# Patient Record
Sex: Female | Born: 1960
Health system: Southern US, Community
[De-identification: ages and names within clinical notes are randomized; demographics above are authoritative.]

## PROBLEM LIST (undated history)

## (undated) DIAGNOSIS — M797 Fibromyalgia: Secondary | ICD-10-CM

## (undated) DIAGNOSIS — R1310 Dysphagia, unspecified: Secondary | ICD-10-CM

## (undated) DIAGNOSIS — U071 COVID-19: Secondary | ICD-10-CM

## (undated) DIAGNOSIS — M1990 Unspecified osteoarthritis, unspecified site: Secondary | ICD-10-CM

## (undated) DIAGNOSIS — K219 Gastro-esophageal reflux disease without esophagitis: Secondary | ICD-10-CM

## (undated) DIAGNOSIS — E785 Hyperlipidemia, unspecified: Secondary | ICD-10-CM

## (undated) DIAGNOSIS — E538 Deficiency of other specified B group vitamins: Secondary | ICD-10-CM

## (undated) DIAGNOSIS — H269 Unspecified cataract: Secondary | ICD-10-CM

## (undated) DIAGNOSIS — F419 Anxiety disorder, unspecified: Secondary | ICD-10-CM

## (undated) DIAGNOSIS — B029 Zoster without complications: Secondary | ICD-10-CM

## (undated) DIAGNOSIS — T7840XA Allergy, unspecified, initial encounter: Secondary | ICD-10-CM

## (undated) DIAGNOSIS — D649 Anemia, unspecified: Secondary | ICD-10-CM

## (undated) DIAGNOSIS — F32A Depression, unspecified: Secondary | ICD-10-CM

## (undated) HISTORY — DX: Gastro-esophageal reflux disease without esophagitis: K21.9

## (undated) HISTORY — PX: COLONOSCOPY: SHX174

## (undated) HISTORY — DX: Allergy, unspecified, initial encounter: T78.40XA

## (undated) HISTORY — DX: Deficiency of other specified B group vitamins: E53.8

## (undated) HISTORY — PX: APPENDECTOMY: SHX54

## (undated) HISTORY — PX: SHOULDER SURGERY: SHX246

## (undated) HISTORY — PX: HEMORRHOID SURGERY: SHX153

## (undated) HISTORY — DX: Anxiety disorder, unspecified: F41.9

## (undated) HISTORY — DX: Fibromyalgia: M79.7

## (undated) HISTORY — PX: ABDOMINAL HYSTERECTOMY: SHX81

## (undated) HISTORY — DX: Unspecified cataract: H26.9

## (undated) HISTORY — PX: CHOLECYSTECTOMY: SHX55

## (undated) HISTORY — DX: Depression, unspecified: F32.A

## (undated) HISTORY — PX: CATARACT EXTRACTION: SUR2

## (undated) HISTORY — DX: Anemia, unspecified: D64.9

## (undated) HISTORY — DX: COVID-19: U07.1

## (undated) HISTORY — DX: Dysphagia, unspecified: R13.10

## (undated) HISTORY — DX: Zoster without complications: B02.9

## (undated) HISTORY — DX: Hyperlipidemia, unspecified: E78.5

## (undated) HISTORY — DX: Unspecified osteoarthritis, unspecified site: M19.90

---

## 2000-03-06 ENCOUNTER — Ambulatory Visit (HOSPITAL_COMMUNITY): Admission: RE | Admit: 2000-03-06 | Discharge: 2000-03-06 | Payer: Self-pay | Admitting: Family Medicine

## 2000-03-06 ENCOUNTER — Encounter: Payer: Self-pay | Admitting: Family Medicine

## 2011-12-13 ENCOUNTER — Encounter: Payer: Self-pay | Admitting: Gastroenterology

## 2011-12-13 HISTORY — PX: ESOPHAGOGASTRODUODENOSCOPY: SHX1529

## 2011-12-13 HISTORY — PX: COLONOSCOPY: SHX174

## 2014-05-12 DIAGNOSIS — E785 Hyperlipidemia, unspecified: Secondary | ICD-10-CM | POA: Diagnosis not present

## 2014-05-12 DIAGNOSIS — Z79899 Other long term (current) drug therapy: Secondary | ICD-10-CM | POA: Diagnosis not present

## 2014-05-12 DIAGNOSIS — Z1231 Encounter for screening mammogram for malignant neoplasm of breast: Secondary | ICD-10-CM | POA: Diagnosis not present

## 2014-05-12 DIAGNOSIS — E559 Vitamin D deficiency, unspecified: Secondary | ICD-10-CM | POA: Diagnosis not present

## 2014-05-12 DIAGNOSIS — N951 Menopausal and female climacteric states: Secondary | ICD-10-CM | POA: Diagnosis not present

## 2014-05-12 DIAGNOSIS — G4700 Insomnia, unspecified: Secondary | ICD-10-CM | POA: Diagnosis not present

## 2014-05-12 DIAGNOSIS — F419 Anxiety disorder, unspecified: Secondary | ICD-10-CM | POA: Diagnosis not present

## 2014-05-12 DIAGNOSIS — Z6829 Body mass index (BMI) 29.0-29.9, adult: Secondary | ICD-10-CM | POA: Diagnosis not present

## 2014-05-29 DIAGNOSIS — Z1231 Encounter for screening mammogram for malignant neoplasm of breast: Secondary | ICD-10-CM | POA: Diagnosis not present

## 2014-06-08 DIAGNOSIS — R928 Other abnormal and inconclusive findings on diagnostic imaging of breast: Secondary | ICD-10-CM | POA: Diagnosis not present

## 2014-08-04 DIAGNOSIS — M791 Myalgia: Secondary | ICD-10-CM | POA: Diagnosis not present

## 2014-08-04 DIAGNOSIS — M25511 Pain in right shoulder: Secondary | ICD-10-CM | POA: Diagnosis not present

## 2014-08-04 DIAGNOSIS — Z6829 Body mass index (BMI) 29.0-29.9, adult: Secondary | ICD-10-CM | POA: Diagnosis not present

## 2014-10-27 DIAGNOSIS — Z6829 Body mass index (BMI) 29.0-29.9, adult: Secondary | ICD-10-CM | POA: Diagnosis not present

## 2014-10-27 DIAGNOSIS — N951 Menopausal and female climacteric states: Secondary | ICD-10-CM | POA: Diagnosis not present

## 2014-10-27 DIAGNOSIS — M25511 Pain in right shoulder: Secondary | ICD-10-CM | POA: Diagnosis not present

## 2014-11-09 DIAGNOSIS — M25511 Pain in right shoulder: Secondary | ICD-10-CM | POA: Diagnosis not present

## 2014-11-17 DIAGNOSIS — M25511 Pain in right shoulder: Secondary | ICD-10-CM | POA: Diagnosis not present

## 2014-11-25 DIAGNOSIS — N951 Menopausal and female climacteric states: Secondary | ICD-10-CM | POA: Diagnosis not present

## 2014-11-25 DIAGNOSIS — F419 Anxiety disorder, unspecified: Secondary | ICD-10-CM | POA: Diagnosis not present

## 2014-11-25 DIAGNOSIS — G4700 Insomnia, unspecified: Secondary | ICD-10-CM | POA: Diagnosis not present

## 2014-11-30 DIAGNOSIS — L570 Actinic keratosis: Secondary | ICD-10-CM | POA: Diagnosis not present

## 2014-11-30 DIAGNOSIS — M545 Low back pain: Secondary | ICD-10-CM | POA: Diagnosis not present

## 2014-11-30 DIAGNOSIS — M25511 Pain in right shoulder: Secondary | ICD-10-CM | POA: Diagnosis not present

## 2014-11-30 DIAGNOSIS — R300 Dysuria: Secondary | ICD-10-CM | POA: Diagnosis not present

## 2014-11-30 DIAGNOSIS — B373 Candidiasis of vulva and vagina: Secondary | ICD-10-CM | POA: Diagnosis not present

## 2014-12-08 DIAGNOSIS — M25511 Pain in right shoulder: Secondary | ICD-10-CM | POA: Diagnosis not present

## 2014-12-09 DIAGNOSIS — M25511 Pain in right shoulder: Secondary | ICD-10-CM | POA: Diagnosis not present

## 2014-12-15 DIAGNOSIS — M25511 Pain in right shoulder: Secondary | ICD-10-CM | POA: Diagnosis not present

## 2015-03-10 DIAGNOSIS — G4700 Insomnia, unspecified: Secondary | ICD-10-CM | POA: Diagnosis not present

## 2015-03-10 DIAGNOSIS — J019 Acute sinusitis, unspecified: Secondary | ICD-10-CM | POA: Diagnosis not present

## 2015-03-10 DIAGNOSIS — R609 Edema, unspecified: Secondary | ICD-10-CM | POA: Diagnosis not present

## 2015-03-10 DIAGNOSIS — N951 Menopausal and female climacteric states: Secondary | ICD-10-CM | POA: Diagnosis not present

## 2015-03-10 DIAGNOSIS — F419 Anxiety disorder, unspecified: Secondary | ICD-10-CM | POA: Diagnosis not present

## 2015-03-10 DIAGNOSIS — E538 Deficiency of other specified B group vitamins: Secondary | ICD-10-CM | POA: Diagnosis not present

## 2015-03-10 DIAGNOSIS — R5383 Other fatigue: Secondary | ICD-10-CM | POA: Diagnosis not present

## 2015-03-10 DIAGNOSIS — J301 Allergic rhinitis due to pollen: Secondary | ICD-10-CM | POA: Diagnosis not present

## 2015-05-18 DIAGNOSIS — M791 Myalgia: Secondary | ICD-10-CM | POA: Diagnosis not present

## 2015-05-18 DIAGNOSIS — M25511 Pain in right shoulder: Secondary | ICD-10-CM | POA: Diagnosis not present

## 2015-05-18 DIAGNOSIS — E538 Deficiency of other specified B group vitamins: Secondary | ICD-10-CM | POA: Diagnosis not present

## 2015-05-18 DIAGNOSIS — J019 Acute sinusitis, unspecified: Secondary | ICD-10-CM | POA: Diagnosis not present

## 2015-07-19 DIAGNOSIS — R300 Dysuria: Secondary | ICD-10-CM | POA: Diagnosis not present

## 2015-07-19 DIAGNOSIS — E663 Overweight: Secondary | ICD-10-CM | POA: Diagnosis not present

## 2015-07-19 DIAGNOSIS — N951 Menopausal and female climacteric states: Secondary | ICD-10-CM | POA: Diagnosis not present

## 2015-07-19 DIAGNOSIS — Z6829 Body mass index (BMI) 29.0-29.9, adult: Secondary | ICD-10-CM | POA: Diagnosis not present

## 2015-07-19 DIAGNOSIS — N390 Urinary tract infection, site not specified: Secondary | ICD-10-CM | POA: Diagnosis not present

## 2015-07-29 DIAGNOSIS — R102 Pelvic and perineal pain: Secondary | ICD-10-CM | POA: Diagnosis not present

## 2015-07-29 DIAGNOSIS — R350 Frequency of micturition: Secondary | ICD-10-CM | POA: Diagnosis not present

## 2015-07-29 DIAGNOSIS — R3915 Urgency of urination: Secondary | ICD-10-CM | POA: Diagnosis not present

## 2015-08-10 DIAGNOSIS — Z1231 Encounter for screening mammogram for malignant neoplasm of breast: Secondary | ICD-10-CM | POA: Diagnosis not present

## 2015-08-12 DIAGNOSIS — R102 Pelvic and perineal pain: Secondary | ICD-10-CM | POA: Diagnosis not present

## 2015-08-12 DIAGNOSIS — R350 Frequency of micturition: Secondary | ICD-10-CM | POA: Diagnosis not present

## 2015-08-12 DIAGNOSIS — R3915 Urgency of urination: Secondary | ICD-10-CM | POA: Diagnosis not present

## 2015-08-24 DIAGNOSIS — N3289 Other specified disorders of bladder: Secondary | ICD-10-CM | POA: Diagnosis not present

## 2015-08-24 DIAGNOSIS — N3281 Overactive bladder: Secondary | ICD-10-CM | POA: Diagnosis not present

## 2015-08-25 DIAGNOSIS — R350 Frequency of micturition: Secondary | ICD-10-CM | POA: Diagnosis not present

## 2015-08-25 DIAGNOSIS — R102 Pelvic and perineal pain: Secondary | ICD-10-CM | POA: Diagnosis not present

## 2015-08-25 DIAGNOSIS — N3010 Interstitial cystitis (chronic) without hematuria: Secondary | ICD-10-CM | POA: Diagnosis not present

## 2015-08-25 DIAGNOSIS — R3915 Urgency of urination: Secondary | ICD-10-CM | POA: Diagnosis not present

## 2015-09-23 DIAGNOSIS — M25511 Pain in right shoulder: Secondary | ICD-10-CM | POA: Diagnosis not present

## 2015-09-24 DIAGNOSIS — N3289 Other specified disorders of bladder: Secondary | ICD-10-CM | POA: Diagnosis not present

## 2015-09-29 DIAGNOSIS — M25511 Pain in right shoulder: Secondary | ICD-10-CM | POA: Diagnosis not present

## 2015-10-06 DIAGNOSIS — M19011 Primary osteoarthritis, right shoulder: Secondary | ICD-10-CM | POA: Diagnosis not present

## 2015-11-01 DIAGNOSIS — M19011 Primary osteoarthritis, right shoulder: Secondary | ICD-10-CM | POA: Diagnosis not present

## 2015-11-26 DIAGNOSIS — M7551 Bursitis of right shoulder: Secondary | ICD-10-CM | POA: Diagnosis not present

## 2015-11-26 DIAGNOSIS — M19011 Primary osteoarthritis, right shoulder: Secondary | ICD-10-CM | POA: Diagnosis not present

## 2015-11-26 DIAGNOSIS — G479 Sleep disorder, unspecified: Secondary | ICD-10-CM | POA: Diagnosis not present

## 2015-11-26 DIAGNOSIS — J309 Allergic rhinitis, unspecified: Secondary | ICD-10-CM | POA: Diagnosis not present

## 2015-11-26 DIAGNOSIS — M25511 Pain in right shoulder: Secondary | ICD-10-CM | POA: Diagnosis not present

## 2015-11-26 DIAGNOSIS — Z87891 Personal history of nicotine dependence: Secondary | ICD-10-CM | POA: Diagnosis not present

## 2015-11-26 DIAGNOSIS — M810 Age-related osteoporosis without current pathological fracture: Secondary | ICD-10-CM | POA: Diagnosis not present

## 2015-11-26 DIAGNOSIS — M7542 Impingement syndrome of left shoulder: Secondary | ICD-10-CM | POA: Diagnosis not present

## 2015-11-26 DIAGNOSIS — G8918 Other acute postprocedural pain: Secondary | ICD-10-CM | POA: Diagnosis not present

## 2015-11-29 DIAGNOSIS — M25611 Stiffness of right shoulder, not elsewhere classified: Secondary | ICD-10-CM | POA: Diagnosis not present

## 2015-11-29 DIAGNOSIS — M19011 Primary osteoarthritis, right shoulder: Secondary | ICD-10-CM | POA: Diagnosis not present

## 2015-11-29 DIAGNOSIS — M25511 Pain in right shoulder: Secondary | ICD-10-CM | POA: Diagnosis not present

## 2015-12-02 DIAGNOSIS — M25611 Stiffness of right shoulder, not elsewhere classified: Secondary | ICD-10-CM | POA: Diagnosis not present

## 2015-12-02 DIAGNOSIS — M25511 Pain in right shoulder: Secondary | ICD-10-CM | POA: Diagnosis not present

## 2015-12-02 DIAGNOSIS — M19011 Primary osteoarthritis, right shoulder: Secondary | ICD-10-CM | POA: Diagnosis not present

## 2015-12-09 DIAGNOSIS — M25511 Pain in right shoulder: Secondary | ICD-10-CM | POA: Diagnosis not present

## 2015-12-09 DIAGNOSIS — M25611 Stiffness of right shoulder, not elsewhere classified: Secondary | ICD-10-CM | POA: Diagnosis not present

## 2015-12-09 DIAGNOSIS — M19011 Primary osteoarthritis, right shoulder: Secondary | ICD-10-CM | POA: Diagnosis not present

## 2015-12-14 DIAGNOSIS — M25511 Pain in right shoulder: Secondary | ICD-10-CM | POA: Diagnosis not present

## 2015-12-14 DIAGNOSIS — M19011 Primary osteoarthritis, right shoulder: Secondary | ICD-10-CM | POA: Diagnosis not present

## 2015-12-14 DIAGNOSIS — M25611 Stiffness of right shoulder, not elsewhere classified: Secondary | ICD-10-CM | POA: Diagnosis not present

## 2015-12-16 DIAGNOSIS — L570 Actinic keratosis: Secondary | ICD-10-CM | POA: Diagnosis not present

## 2015-12-16 DIAGNOSIS — R21 Rash and other nonspecific skin eruption: Secondary | ICD-10-CM | POA: Diagnosis not present

## 2015-12-17 DIAGNOSIS — M25511 Pain in right shoulder: Secondary | ICD-10-CM | POA: Diagnosis not present

## 2015-12-17 DIAGNOSIS — M25611 Stiffness of right shoulder, not elsewhere classified: Secondary | ICD-10-CM | POA: Diagnosis not present

## 2015-12-17 DIAGNOSIS — M19011 Primary osteoarthritis, right shoulder: Secondary | ICD-10-CM | POA: Diagnosis not present

## 2015-12-22 DIAGNOSIS — M25511 Pain in right shoulder: Secondary | ICD-10-CM | POA: Diagnosis not present

## 2015-12-22 DIAGNOSIS — M25611 Stiffness of right shoulder, not elsewhere classified: Secondary | ICD-10-CM | POA: Diagnosis not present

## 2015-12-22 DIAGNOSIS — M19011 Primary osteoarthritis, right shoulder: Secondary | ICD-10-CM | POA: Diagnosis not present

## 2015-12-23 DIAGNOSIS — M25511 Pain in right shoulder: Secondary | ICD-10-CM | POA: Diagnosis not present

## 2015-12-23 DIAGNOSIS — M19011 Primary osteoarthritis, right shoulder: Secondary | ICD-10-CM | POA: Diagnosis not present

## 2015-12-23 DIAGNOSIS — M25611 Stiffness of right shoulder, not elsewhere classified: Secondary | ICD-10-CM | POA: Diagnosis not present

## 2016-03-23 DIAGNOSIS — N951 Menopausal and female climacteric states: Secondary | ICD-10-CM | POA: Diagnosis not present

## 2016-03-23 DIAGNOSIS — R079 Chest pain, unspecified: Secondary | ICD-10-CM | POA: Diagnosis not present

## 2016-03-23 DIAGNOSIS — M7989 Other specified soft tissue disorders: Secondary | ICD-10-CM | POA: Diagnosis not present

## 2016-03-23 DIAGNOSIS — R5383 Other fatigue: Secondary | ICD-10-CM | POA: Diagnosis not present

## 2016-03-23 DIAGNOSIS — R0602 Shortness of breath: Secondary | ICD-10-CM | POA: Diagnosis not present

## 2016-03-23 DIAGNOSIS — M791 Myalgia: Secondary | ICD-10-CM | POA: Diagnosis not present

## 2016-03-23 DIAGNOSIS — E559 Vitamin D deficiency, unspecified: Secondary | ICD-10-CM | POA: Diagnosis not present

## 2016-03-23 DIAGNOSIS — G4700 Insomnia, unspecified: Secondary | ICD-10-CM | POA: Diagnosis not present

## 2016-03-23 DIAGNOSIS — E785 Hyperlipidemia, unspecified: Secondary | ICD-10-CM | POA: Diagnosis not present

## 2016-03-23 DIAGNOSIS — J301 Allergic rhinitis due to pollen: Secondary | ICD-10-CM | POA: Diagnosis not present

## 2016-03-28 DIAGNOSIS — R079 Chest pain, unspecified: Secondary | ICD-10-CM | POA: Diagnosis not present

## 2016-04-06 DIAGNOSIS — E785 Hyperlipidemia, unspecified: Secondary | ICD-10-CM | POA: Diagnosis not present

## 2016-04-06 DIAGNOSIS — R5383 Other fatigue: Secondary | ICD-10-CM | POA: Diagnosis not present

## 2016-04-06 DIAGNOSIS — R9439 Abnormal result of other cardiovascular function study: Secondary | ICD-10-CM

## 2016-04-06 DIAGNOSIS — R079 Chest pain, unspecified: Secondary | ICD-10-CM | POA: Diagnosis not present

## 2016-04-06 HISTORY — DX: Chest pain, unspecified: R07.9

## 2016-04-06 HISTORY — DX: Abnormal result of other cardiovascular function study: R94.39

## 2016-04-07 DIAGNOSIS — R079 Chest pain, unspecified: Secondary | ICD-10-CM | POA: Diagnosis not present

## 2016-04-08 DIAGNOSIS — R300 Dysuria: Secondary | ICD-10-CM | POA: Diagnosis not present

## 2016-04-13 DIAGNOSIS — R9439 Abnormal result of other cardiovascular function study: Secondary | ICD-10-CM | POA: Diagnosis not present

## 2016-04-13 DIAGNOSIS — R079 Chest pain, unspecified: Secondary | ICD-10-CM | POA: Diagnosis not present

## 2016-04-13 DIAGNOSIS — R0789 Other chest pain: Secondary | ICD-10-CM | POA: Diagnosis not present

## 2016-04-13 DIAGNOSIS — R9430 Abnormal result of cardiovascular function study, unspecified: Secondary | ICD-10-CM | POA: Diagnosis not present

## 2016-04-24 DIAGNOSIS — R0602 Shortness of breath: Secondary | ICD-10-CM | POA: Diagnosis not present

## 2016-04-25 DIAGNOSIS — R062 Wheezing: Secondary | ICD-10-CM | POA: Diagnosis not present

## 2016-04-25 DIAGNOSIS — M797 Fibromyalgia: Secondary | ICD-10-CM | POA: Diagnosis not present

## 2016-04-25 DIAGNOSIS — R5383 Other fatigue: Secondary | ICD-10-CM | POA: Diagnosis not present

## 2016-04-25 DIAGNOSIS — R0789 Other chest pain: Secondary | ICD-10-CM | POA: Diagnosis not present

## 2016-07-25 DIAGNOSIS — R079 Chest pain, unspecified: Secondary | ICD-10-CM | POA: Diagnosis not present

## 2016-07-25 DIAGNOSIS — R0789 Other chest pain: Secondary | ICD-10-CM | POA: Diagnosis not present

## 2016-08-19 DIAGNOSIS — J069 Acute upper respiratory infection, unspecified: Secondary | ICD-10-CM | POA: Diagnosis not present

## 2016-09-13 DIAGNOSIS — Z79890 Hormone replacement therapy (postmenopausal): Secondary | ICD-10-CM | POA: Diagnosis not present

## 2016-09-19 DIAGNOSIS — M542 Cervicalgia: Secondary | ICD-10-CM | POA: Diagnosis not present

## 2016-09-19 DIAGNOSIS — M9901 Segmental and somatic dysfunction of cervical region: Secondary | ICD-10-CM | POA: Diagnosis not present

## 2016-09-19 DIAGNOSIS — M9903 Segmental and somatic dysfunction of lumbar region: Secondary | ICD-10-CM | POA: Diagnosis not present

## 2016-09-19 DIAGNOSIS — M545 Low back pain: Secondary | ICD-10-CM | POA: Diagnosis not present

## 2016-09-19 DIAGNOSIS — M9902 Segmental and somatic dysfunction of thoracic region: Secondary | ICD-10-CM | POA: Diagnosis not present

## 2016-10-11 DIAGNOSIS — L814 Other melanin hyperpigmentation: Secondary | ICD-10-CM | POA: Diagnosis not present

## 2016-10-11 DIAGNOSIS — Z79890 Hormone replacement therapy (postmenopausal): Secondary | ICD-10-CM | POA: Diagnosis not present

## 2016-10-11 DIAGNOSIS — N951 Menopausal and female climacteric states: Secondary | ICD-10-CM | POA: Diagnosis not present

## 2016-10-11 DIAGNOSIS — D225 Melanocytic nevi of trunk: Secondary | ICD-10-CM | POA: Diagnosis not present

## 2016-10-11 DIAGNOSIS — L821 Other seborrheic keratosis: Secondary | ICD-10-CM | POA: Diagnosis not present

## 2016-10-11 DIAGNOSIS — L820 Inflamed seborrheic keratosis: Secondary | ICD-10-CM | POA: Diagnosis not present

## 2016-10-11 DIAGNOSIS — D2239 Melanocytic nevi of other parts of face: Secondary | ICD-10-CM | POA: Diagnosis not present

## 2016-10-23 DIAGNOSIS — R300 Dysuria: Secondary | ICD-10-CM | POA: Diagnosis not present

## 2016-10-23 DIAGNOSIS — F419 Anxiety disorder, unspecified: Secondary | ICD-10-CM | POA: Diagnosis not present

## 2016-10-23 DIAGNOSIS — M791 Myalgia: Secondary | ICD-10-CM | POA: Diagnosis not present

## 2016-10-23 DIAGNOSIS — G4700 Insomnia, unspecified: Secondary | ICD-10-CM | POA: Diagnosis not present

## 2016-10-23 DIAGNOSIS — Z6829 Body mass index (BMI) 29.0-29.9, adult: Secondary | ICD-10-CM | POA: Diagnosis not present

## 2016-10-23 DIAGNOSIS — N3289 Other specified disorders of bladder: Secondary | ICD-10-CM | POA: Diagnosis not present

## 2016-11-22 DIAGNOSIS — Z79890 Hormone replacement therapy (postmenopausal): Secondary | ICD-10-CM | POA: Diagnosis not present

## 2016-11-22 DIAGNOSIS — N951 Menopausal and female climacteric states: Secondary | ICD-10-CM | POA: Diagnosis not present

## 2017-01-01 DIAGNOSIS — Z1231 Encounter for screening mammogram for malignant neoplasm of breast: Secondary | ICD-10-CM | POA: Diagnosis not present

## 2017-01-10 DIAGNOSIS — N6489 Other specified disorders of breast: Secondary | ICD-10-CM | POA: Diagnosis not present

## 2017-01-10 DIAGNOSIS — R922 Inconclusive mammogram: Secondary | ICD-10-CM | POA: Diagnosis not present

## 2017-01-12 DIAGNOSIS — D2361 Other benign neoplasm of skin of right upper limb, including shoulder: Secondary | ICD-10-CM | POA: Diagnosis not present

## 2017-01-12 DIAGNOSIS — L739 Follicular disorder, unspecified: Secondary | ICD-10-CM | POA: Diagnosis not present

## 2017-01-12 DIAGNOSIS — L570 Actinic keratosis: Secondary | ICD-10-CM | POA: Diagnosis not present

## 2017-01-12 DIAGNOSIS — B079 Viral wart, unspecified: Secondary | ICD-10-CM | POA: Diagnosis not present

## 2017-02-26 DIAGNOSIS — F419 Anxiety disorder, unspecified: Secondary | ICD-10-CM | POA: Diagnosis not present

## 2017-02-26 DIAGNOSIS — M542 Cervicalgia: Secondary | ICD-10-CM | POA: Diagnosis not present

## 2017-02-26 DIAGNOSIS — Z6830 Body mass index (BMI) 30.0-30.9, adult: Secondary | ICD-10-CM | POA: Diagnosis not present

## 2017-03-07 DIAGNOSIS — M791 Myalgia, unspecified site: Secondary | ICD-10-CM | POA: Diagnosis not present

## 2017-03-07 DIAGNOSIS — M542 Cervicalgia: Secondary | ICD-10-CM | POA: Diagnosis not present

## 2017-03-21 DIAGNOSIS — Z79890 Hormone replacement therapy (postmenopausal): Secondary | ICD-10-CM | POA: Diagnosis not present

## 2017-03-21 DIAGNOSIS — M542 Cervicalgia: Secondary | ICD-10-CM | POA: Diagnosis not present

## 2017-03-21 DIAGNOSIS — N951 Menopausal and female climacteric states: Secondary | ICD-10-CM | POA: Diagnosis not present

## 2017-03-21 DIAGNOSIS — M791 Myalgia, unspecified site: Secondary | ICD-10-CM | POA: Diagnosis not present

## 2017-03-22 DIAGNOSIS — Z79890 Hormone replacement therapy (postmenopausal): Secondary | ICD-10-CM | POA: Diagnosis not present

## 2017-03-22 DIAGNOSIS — Z79899 Other long term (current) drug therapy: Secondary | ICD-10-CM | POA: Diagnosis not present

## 2017-03-22 DIAGNOSIS — N951 Menopausal and female climacteric states: Secondary | ICD-10-CM | POA: Diagnosis not present

## 2017-05-07 DIAGNOSIS — N951 Menopausal and female climacteric states: Secondary | ICD-10-CM | POA: Diagnosis not present

## 2017-05-07 DIAGNOSIS — R197 Diarrhea, unspecified: Secondary | ICD-10-CM | POA: Diagnosis not present

## 2017-05-07 DIAGNOSIS — J189 Pneumonia, unspecified organism: Secondary | ICD-10-CM | POA: Diagnosis not present

## 2017-05-07 DIAGNOSIS — J301 Allergic rhinitis due to pollen: Secondary | ICD-10-CM | POA: Diagnosis not present

## 2017-05-07 DIAGNOSIS — B349 Viral infection, unspecified: Secondary | ICD-10-CM | POA: Diagnosis not present

## 2017-05-07 DIAGNOSIS — Z6829 Body mass index (BMI) 29.0-29.9, adult: Secondary | ICD-10-CM | POA: Diagnosis not present

## 2017-05-16 DIAGNOSIS — R197 Diarrhea, unspecified: Secondary | ICD-10-CM | POA: Diagnosis not present

## 2017-06-16 DIAGNOSIS — J069 Acute upper respiratory infection, unspecified: Secondary | ICD-10-CM | POA: Diagnosis not present

## 2017-06-28 ENCOUNTER — Encounter: Payer: Self-pay | Admitting: Gastroenterology

## 2017-08-07 ENCOUNTER — Encounter: Payer: Self-pay | Admitting: Gastroenterology

## 2017-08-07 ENCOUNTER — Ambulatory Visit (INDEPENDENT_AMBULATORY_CARE_PROVIDER_SITE_OTHER): Payer: PPO | Admitting: Gastroenterology

## 2017-08-07 VITALS — BP 122/70 | HR 66 | Ht 63.0 in | Wt 166.0 lb

## 2017-08-07 DIAGNOSIS — K219 Gastro-esophageal reflux disease without esophagitis: Secondary | ICD-10-CM

## 2017-08-07 DIAGNOSIS — R1310 Dysphagia, unspecified: Secondary | ICD-10-CM

## 2017-08-07 MED ORDER — ESOMEPRAZOLE MAGNESIUM 40 MG PO CPDR: 40.0000 mg | DELAYED_RELEASE_CAPSULE | Freq: Every day | ORAL | 6 refills | Status: DC

## 2017-08-07 NOTE — Progress Notes (Signed)
Chief Complaint: dysphagia  Referring Provider:  Dr Micheal Likens      ASSESSMENT AND PLAN;   #1.  Esophageal dysphagia. (Differential diagnoses includes esophageal stricture, Schatzki's ring, motility disorder, eosinophilic esophagitis, pill induced esophagitis, rule out esophageal carcinoma or extrinsic lesions)  #2. GERD with small hiatal hernia, EGD 12/2011, neg CLO and SB bx.  #3. Negative colonoscopy 12/2011 (report not available-awaiting record de-conversion).  Plan: - Switch back Nexium 40mg  once a day. - Proceed with EGD with dilatation.  I have discussed the risks and benefits.  The risks including risk of perforation requiring laparotomy, bleeding after polypectomy requiring blood transfusions and risks of anesthesia/sedation were discussed.  Rare risks of missing UGI neoplasms were also discussed.  Alternatives were given.  Patient is fully aware and agrees to proceed. All the questions were answered. This will be scheduled in upcoming days.  Patient is to report immediately if there is any significant weight loss or excessive bleeding until then. Consent forms were given for review.    HPI:    57yr old Accompanied by her husband With dysphagia for capsules Getting worse over the last 5 to 77months  Has been having regurgitation and occ hearburn Has been on advil for OA 2-3/day. No nausea, vomiting, odynophagia or dysphagia. Alt  diarrhea and constipation.  There is no melena or hematochezia. No unintentional weight loss.   Past Medical History:  Diagnosis Date  . Anxiety   . Dysphagia   . GERD (gastroesophageal reflux disease)   . Vitamin B12 deficiency    Past Surgical History:  Procedure Laterality Date  . ABDOMINAL HYSTERECTOMY    . APPENDECTOMY    . CATARACT EXTRACTION    . CESAREAN SECTION    . CHOLECYSTECTOMY    . HEMORRHOID SURGERY    . SHOULDER SURGERY      Family History  Problem Relation Age of Onset  . Colon cancer Neg Hx     Social History    Tobacco Use  . Smoking status: Never Smoker  . Smokeless tobacco: Never Used  Substance Use Topics  . Alcohol use: Never    Frequency: Never  . Drug use: Never    Current Outpatient Medications  Medication Sig Dispense Refill  . Cholecalciferol (VITAMIN D3) 2000 units TABS Take by mouth.    . methocarbamol (ROBAXIN) 750 MG tablet TAKE ONE TABLET BY MOUTH THREE TIMES DAILY    . NON FORMULARY daily.    . NON FORMULARY 2 (two) times daily.    Marland Kitchen omeprazole (PRILOSEC) 20 MG capsule Take 20 mg by mouth daily.    . Probiotic Product (PROBIOTIC-10 PO) Take by mouth daily.    Marland Kitchen ALPRAZolam (XANAX) 0.5 MG tablet Take 0.5 mg by mouth daily as needed.  5  . cyanocobalamin (,VITAMIN B-12,) 1000 MCG/ML injection every 30 (thirty) days.  12  . Eszopiclone 3 MG TABS Take 3 mg by mouth at bedtime.  5  . oxybutynin (DITROPAN) 5 MG tablet TAKE ONE TABLET BY MOUTH TWICE DAILY AS NEEDED for bladder spasm  5  . traMADol (ULTRAM) 50 MG tablet Take by mouth.     No current facility-administered medications for this visit.     Allergies no known allergies  Review of Systems:  Constitutional: Denies fever, chills, diaphoresis, appetite change and fatigue.  HEENT: Denies photophobia, eye pain, redness, hearing loss, ear pain, congestion, sore throat, rhinorrhea, sneezing, mouth sores, neck pain, neck stiffness and tinnitus.   Respiratory: Denies SOB, DOE, cough, chest  tightness,  and wheezing.   Cardiovascular: Denies chest pain, palpitations and leg swelling.  Genitourinary: Denies dysuria, urgency, frequency, hematuria, flank pain and difficulty urinating.  Musculoskeletal: Denies myalgias, back pain, joint swelling, arthralgias and gait problem.  Skin: No rash.  Neurological: Denies dizziness, seizures, syncope, weakness, light-headedness, numbness and headaches.  Hematological: Denies adenopathy. Easy bruising, personal or family bleeding history  Psychiatric/Behavioral: Has anxiety or  depression     Physical Exam:    BP 122/70   Pulse 66   Ht 5\' 3"  (1.6 m)   Wt 166 lb (75.3 kg)   BMI 29.41 kg/m  Filed Weights   08/07/17 1351  Weight: 166 lb (75.3 kg)   Constitutional:  Well-developed, in no acute distress. Psychiatric: Normal mood and affect. Behavior is normal. HEENT: Pupils normal.  Conjunctivae are normal. No scleral icterus. Neck supple.  Cardiovascular: Normal rate, regular rhythm. No edema Pulmonary/chest: Effort normal and breath sounds normal. No wheezing, rales or rhonchi. Abdominal: Soft, nondistended. Nontender. Bowel sounds active throughout. There are no masses palpable. No hepatomegaly. Rectal:  defered Neurological: Alert and oriented to person place and time. Skin: Skin is warm and dry. No rashes noted.    Carmell Austria, MD   Cc: Dr Micheal Likens

## 2017-08-07 NOTE — Patient Instructions (Signed)
If you are age 56 or older, your body mass index should be between 23-30. Your Body mass index is 29.41 kg/m. If this is out of the aforementioned range listed, please consider follow up with your Primary Care Provider.  If you are age 72 or younger, your body mass index should be between 19-25. Your Body mass index is 29.41 kg/m. If this is out of the aformentioned range listed, please consider follow up with your Primary Care Provider.   We have sent the following medications to your pharmacy for you to pick up at your convenience: Nexium 40mg  by mouth daily. (May open capsule and mix with applesauce or pudding)  You have been scheduled for an endoscopy. Please follow written instructions given to you at your visit today. If you use inhalers (even only as needed), please bring them with you on the day of your procedure. Your physician has requested that you go to www.startemmi.com and enter the access code given to you at your visit today. This web site gives a general overview about your procedure. However, you should still follow specific instructions given to you by our office regarding your preparation for the procedure.  Thank you,  Dr. Jackquline Denmark

## 2017-08-08 ENCOUNTER — Ambulatory Visit (AMBULATORY_SURGERY_CENTER): Payer: PPO | Admitting: Gastroenterology

## 2017-08-08 ENCOUNTER — Encounter: Payer: Self-pay | Admitting: Gastroenterology

## 2017-08-08 ENCOUNTER — Other Ambulatory Visit: Payer: Self-pay

## 2017-08-08 VITALS — BP 120/79 | HR 67 | Temp 98.2°F | Resp 13 | Ht 63.0 in | Wt 166.0 lb

## 2017-08-08 DIAGNOSIS — R1310 Dysphagia, unspecified: Secondary | ICD-10-CM

## 2017-08-08 DIAGNOSIS — K219 Gastro-esophageal reflux disease without esophagitis: Secondary | ICD-10-CM | POA: Diagnosis not present

## 2017-08-08 DIAGNOSIS — K2900 Acute gastritis without bleeding: Secondary | ICD-10-CM | POA: Diagnosis not present

## 2017-08-08 DIAGNOSIS — K208 Other esophagitis: Secondary | ICD-10-CM | POA: Diagnosis not present

## 2017-08-08 DIAGNOSIS — K2950 Unspecified chronic gastritis without bleeding: Secondary | ICD-10-CM | POA: Diagnosis not present

## 2017-08-08 DIAGNOSIS — M797 Fibromyalgia: Secondary | ICD-10-CM | POA: Diagnosis not present

## 2017-08-08 DIAGNOSIS — F419 Anxiety disorder, unspecified: Secondary | ICD-10-CM | POA: Diagnosis not present

## 2017-08-08 MED ORDER — SODIUM CHLORIDE 0.9 % IV SOLN: 500.0000 mL | Freq: Once | INTRAVENOUS | Status: DC

## 2017-08-08 NOTE — Op Note (Signed)
[facility] Patient Name: Chelsea Bennett Procedure Date: 08/08/2017 8:01 AM MRN: [id number] Endoscopist: Jackquline Denmark MD, MD Age: 57 Referring MD:  Date of Birth: 1960/09/21 Gender: Female Account #: [id number] Procedure:                Upper GI endoscopy Indications:              Dysphagia, GERD Medicines:                Monitored Anesthesia Care Procedure:                Pre-Anesthesia Assessment:                           - Prior to the procedure, a History and Physical                            was performed, and patient medications and                            allergies were reviewed. The patient is competent.                            The risks and benefits of the procedure and the                            sedation options and risks were discussed with the                            patient. All questions were answered and informed                            consent was obtained. Patient identification and                            proposed procedure were verified by the physician                            in the procedure room. Mental Status Examination:                            alert and oriented. Prophylactic Antibiotics: The                            patient does not require prophylactic antibiotics.                            Prior Anticoagulants: The patient has taken no                            previous anticoagulant or antiplatelet agents. ASA                            Grade Assessment: II - A patient with mild systemic  disease. After reviewing the risks and benefits,                            the patient was deemed in satisfactory condition to                            undergo the procedure. The anesthesia plan was to                            use monitored anesthesia care (MAC). Immediately                            prior to administration of medications, the patient                            was re-assessed for  adequacy to receive sedatives.                            The heart rate, respiratory rate, oxygen                            saturations, blood pressure, adequacy of pulmonary                            ventilation, and response to care were monitored                            throughout the procedure. The physical status of                            the patient was re-assessed after the procedure.                           After obtaining informed consent, the endoscope was                            passed under direct vision. Throughout the                            procedure, the patient's blood pressure, pulse, and                            oxygen saturations were monitored continuously. The                            Model GIF-HQ190 [id number]) scope was introduced                            through the mouth, and advanced to the second part                            of duodenum. The upper GI endoscopy was  accomplished without difficulty. The patient                            tolerated the procedure well. Scope In: Scope Out: Findings:                 The examined esophagus was mildly tortuous.                            Biopsies were taken with a cold forceps for                            histology. The scope was withdrawn. Dilation was                            performed with a Maloney dilator with no resistance                            at 50 Fr.                           Localized mild inflammation characterized by                            erythema was found in the gastric antrum. Biopsies                            were taken with a cold forceps for histology.                           A small hiatal hernia was present.                           The exam was otherwise without abnormality. Complications:            No immediate complications. Estimated Blood Loss:     Estimated blood loss: none. Impression:               Small hiatal  hernia.                           Mild Gastritis.                           Mildly tortuous esophagus status post esophageal                            dilatation. Recommendation:           - Patient has a contact number available for                            emergencies. The signs and symptoms of potential                            delayed complications were discussed with the  patient. Return to normal activities tomorrow.                            Written discharge instructions were provided to the                            patient.                           - Soft diet today. Chew foods especially meats and                            breads well and and eat slowly. Take all capsules                            /medications in standing position.                           - Continue present medications. Start Nexium 55m                            po qd.                           - Await pathology results.                           - Return to GI clinic PRN. RJackquline DenmarkMD, MD 08/08/2017 8:24:49 AM This report has been signed electronically.

## 2017-08-08 NOTE — Progress Notes (Signed)
Called to room to assist during endoscopic procedure.  Patient ID and intended procedure confirmed with present staff. Received instructions for my participation in the procedure from the performing physician.  

## 2017-08-08 NOTE — Progress Notes (Signed)
To recovery, report to RN, VSS. 

## 2017-08-08 NOTE — Patient Instructions (Signed)
Soft diet today. Information on soft diet given.  YOU HAD AN ENDOSCOPIC PROCEDURE TODAY AT [facility]:   Refer to the procedure report that was given to you for any specific questions about what was found during the examination.  If the procedure report does not answer your questions, please call your gastroenterologist to clarify.  If you requested that your care partner not be given the details of your procedure findings, then the procedure report has been included in a sealed envelope for you to review at your convenience later.  YOU SHOULD EXPECT: Some feelings of bloating in the abdomen. Passage of more gas than usual.  Walking can help get rid of the air that was put into your GI tract during the procedure and reduce the bloating. If you had a lower endoscopy (such as a colonoscopy or flexible sigmoidoscopy) you may notice spotting of blood in your stool or on the toilet paper. If you underwent a bowel prep for your procedure, you may not have a normal bowel movement for a few days.  Please Note:  You might notice some irritation and congestion in your nose or some drainage.  This is from the oxygen used during your procedure.  There is no need for concern and it should clear up in a day or so.  SYMPTOMS TO REPORT IMMEDIATELY:    Following upper endoscopy (EGD)  Vomiting of blood or coffee ground material  New chest pain or pain under the shoulder blades  Painful or persistently difficult swallowing  New shortness of breath  Fever of 100F or higher  Black, tarry-looking stools  For urgent or emergent issues, a gastroenterologist can be reached at any hour by calling [phone].   DIET:  We do recommend a small meal at first, but then you may proceed to your regular diet.  Drink plenty of fluids but you should avoid alcoholic beverages for 24 hours.  ACTIVITY:  You should plan to take it easy for the rest of today and you should NOT DRIVE or use heavy machinery  until tomorrow (because of the sedation medicines used during the test).    FOLLOW UP: Our staff will call the number listed on your records the next business day following your procedure to check on you and address any questions or concerns that you may have regarding the information given to you following your procedure. If we do not reach you, we will leave a message.  However, if you are feeling well and you are not experiencing any problems, there is no need to return our call.  We will assume that you have returned to your regular daily activities without incident.  If any biopsies were taken you will be contacted by phone or by letter within the next 1-3 weeks.  Please call us at [phone] if you have not heard about the biopsies in 3 weeks.    SIGNATURES/CONFIDENTIALITY: You and/or your care partner have signed paperwork which will be entered into your electronic medical record.  These signatures attest to the fact that that the information above on your After Visit Summary has been reviewed and is understood.  Full responsibility of the confidentiality of this discharge information lies with you and/or your care-partner.

## 2017-08-08 NOTE — Progress Notes (Signed)
Pt's states no medical or surgical changes since previsit or office visit. 

## 2017-08-09 ENCOUNTER — Telehealth: Payer: Self-pay

## 2017-08-09 NOTE — Telephone Encounter (Signed)
  Follow up Call-  Call back number 08/08/2017  Post procedure Call Back phone  # [phone]  Permission to leave phone message Yes  Some recent data might be hidden     Patient questions:  Do you have a fever, pain , or abdominal swelling? No. Pain Score  0 *  Have you tolerated food without any problems? Yes.    Have you been able to return to your normal activities? Yes.    Do you have any questions about your discharge instructions: Diet   No. Medications  No. Follow up visit  No.  Do you have questions or concerns about your Care? No.  Actions: * If pain score is 4 or above: No action needed, p

## 2017-08-13 ENCOUNTER — Encounter: Payer: Self-pay | Admitting: Gastroenterology

## 2017-08-14 ENCOUNTER — Telehealth: Payer: Self-pay

## 2017-08-14 NOTE — Telephone Encounter (Signed)
Requesting pathology results.  Please advise.  Thank you

## 2017-08-21 DIAGNOSIS — M7501 Adhesive capsulitis of right shoulder: Secondary | ICD-10-CM | POA: Diagnosis not present

## 2017-08-23 ENCOUNTER — Telehealth: Payer: Self-pay | Admitting: Gastroenterology

## 2017-08-23 NOTE — Telephone Encounter (Signed)
I spoke with the patient.  The result letter was sent 08/14/17 but she has not received it yet.  I did read the letter to her.

## 2017-09-18 DIAGNOSIS — N951 Menopausal and female climacteric states: Secondary | ICD-10-CM | POA: Diagnosis not present

## 2017-09-18 DIAGNOSIS — Z1339 Encounter for screening examination for other mental health and behavioral disorders: Secondary | ICD-10-CM | POA: Diagnosis not present

## 2017-09-18 DIAGNOSIS — N3289 Other specified disorders of bladder: Secondary | ICD-10-CM | POA: Diagnosis not present

## 2017-09-18 DIAGNOSIS — E559 Vitamin D deficiency, unspecified: Secondary | ICD-10-CM | POA: Diagnosis not present

## 2017-09-18 DIAGNOSIS — M791 Myalgia, unspecified site: Secondary | ICD-10-CM | POA: Diagnosis not present

## 2017-09-18 DIAGNOSIS — E785 Hyperlipidemia, unspecified: Secondary | ICD-10-CM | POA: Diagnosis not present

## 2017-09-18 DIAGNOSIS — Z6829 Body mass index (BMI) 29.0-29.9, adult: Secondary | ICD-10-CM | POA: Diagnosis not present

## 2017-09-18 DIAGNOSIS — G4700 Insomnia, unspecified: Secondary | ICD-10-CM | POA: Diagnosis not present

## 2017-09-18 DIAGNOSIS — E663 Overweight: Secondary | ICD-10-CM | POA: Diagnosis not present

## 2017-10-05 ENCOUNTER — Encounter: Payer: Self-pay | Admitting: Gastroenterology

## 2017-10-08 ENCOUNTER — Encounter: Payer: Self-pay | Admitting: Gastroenterology

## 2017-10-09 ENCOUNTER — Ambulatory Visit: Payer: PPO | Admitting: Gastroenterology

## 2017-10-16 DIAGNOSIS — C44519 Basal cell carcinoma of skin of other part of trunk: Secondary | ICD-10-CM | POA: Diagnosis not present

## 2017-10-16 DIAGNOSIS — L739 Follicular disorder, unspecified: Secondary | ICD-10-CM | POA: Diagnosis not present

## 2017-10-16 DIAGNOSIS — C44319 Basal cell carcinoma of skin of other parts of face: Secondary | ICD-10-CM | POA: Diagnosis not present

## 2017-10-25 ENCOUNTER — Telehealth: Payer: Self-pay | Admitting: Gastroenterology

## 2017-10-25 NOTE — Telephone Encounter (Signed)
She called with a complaint of left sided pain, unrelenting since yesterday.  No fever and she had 2 normal bowel movements today.  There are no appointments available today or tomorrow, I recommended that she be evaluated in ED or Urgent Care which she agreed she would do "if the pain doesn't resolve".

## 2017-10-29 ENCOUNTER — Encounter: Payer: Self-pay | Admitting: Gastroenterology

## 2017-10-30 ENCOUNTER — Ambulatory Visit: Payer: PPO | Admitting: Gastroenterology

## 2017-10-31 ENCOUNTER — Encounter: Payer: Self-pay | Admitting: Gastroenterology

## 2017-11-18 DIAGNOSIS — N3091 Cystitis, unspecified with hematuria: Secondary | ICD-10-CM | POA: Diagnosis not present

## 2017-11-18 DIAGNOSIS — N3000 Acute cystitis without hematuria: Secondary | ICD-10-CM | POA: Diagnosis not present

## 2018-02-18 ENCOUNTER — Other Ambulatory Visit: Payer: Self-pay | Admitting: Gastroenterology

## 2018-03-07 DIAGNOSIS — R0602 Shortness of breath: Secondary | ICD-10-CM | POA: Diagnosis not present

## 2018-03-07 DIAGNOSIS — R768 Other specified abnormal immunological findings in serum: Secondary | ICD-10-CM | POA: Diagnosis not present

## 2018-03-07 DIAGNOSIS — E785 Hyperlipidemia, unspecified: Secondary | ICD-10-CM | POA: Diagnosis not present

## 2018-03-07 DIAGNOSIS — M25449 Effusion, unspecified hand: Secondary | ICD-10-CM | POA: Diagnosis not present

## 2018-03-07 DIAGNOSIS — Z6829 Body mass index (BMI) 29.0-29.9, adult: Secondary | ICD-10-CM | POA: Diagnosis not present

## 2018-03-07 DIAGNOSIS — J301 Allergic rhinitis due to pollen: Secondary | ICD-10-CM | POA: Diagnosis not present

## 2018-03-07 DIAGNOSIS — J019 Acute sinusitis, unspecified: Secondary | ICD-10-CM | POA: Diagnosis not present

## 2018-03-07 DIAGNOSIS — Z1231 Encounter for screening mammogram for malignant neoplasm of breast: Secondary | ICD-10-CM | POA: Diagnosis not present

## 2018-03-07 DIAGNOSIS — R2241 Localized swelling, mass and lump, right lower limb: Secondary | ICD-10-CM | POA: Diagnosis not present

## 2018-03-15 DIAGNOSIS — Z1231 Encounter for screening mammogram for malignant neoplasm of breast: Secondary | ICD-10-CM | POA: Diagnosis not present

## 2018-09-03 DIAGNOSIS — F419 Anxiety disorder, unspecified: Secondary | ICD-10-CM | POA: Diagnosis not present

## 2018-09-03 DIAGNOSIS — N951 Menopausal and female climacteric states: Secondary | ICD-10-CM | POA: Diagnosis not present

## 2018-09-03 DIAGNOSIS — M7989 Other specified soft tissue disorders: Secondary | ICD-10-CM | POA: Diagnosis not present

## 2018-09-03 DIAGNOSIS — Z79899 Other long term (current) drug therapy: Secondary | ICD-10-CM | POA: Diagnosis not present

## 2018-09-03 DIAGNOSIS — N3289 Other specified disorders of bladder: Secondary | ICD-10-CM | POA: Diagnosis not present

## 2018-09-03 DIAGNOSIS — E538 Deficiency of other specified B group vitamins: Secondary | ICD-10-CM | POA: Diagnosis not present

## 2018-09-03 DIAGNOSIS — Z6829 Body mass index (BMI) 29.0-29.9, adult: Secondary | ICD-10-CM | POA: Diagnosis not present

## 2018-09-03 DIAGNOSIS — G4700 Insomnia, unspecified: Secondary | ICD-10-CM | POA: Diagnosis not present

## 2018-09-03 DIAGNOSIS — E785 Hyperlipidemia, unspecified: Secondary | ICD-10-CM | POA: Diagnosis not present

## 2018-09-03 DIAGNOSIS — R768 Other specified abnormal immunological findings in serum: Secondary | ICD-10-CM | POA: Diagnosis not present

## 2018-09-16 ENCOUNTER — Other Ambulatory Visit: Payer: Self-pay | Admitting: Gastroenterology

## 2018-11-28 DIAGNOSIS — M545 Low back pain: Secondary | ICD-10-CM | POA: Diagnosis not present

## 2018-11-28 DIAGNOSIS — M9903 Segmental and somatic dysfunction of lumbar region: Secondary | ICD-10-CM | POA: Diagnosis not present

## 2018-11-28 DIAGNOSIS — M9901 Segmental and somatic dysfunction of cervical region: Secondary | ICD-10-CM | POA: Diagnosis not present

## 2018-11-28 DIAGNOSIS — M546 Pain in thoracic spine: Secondary | ICD-10-CM | POA: Diagnosis not present

## 2018-11-28 DIAGNOSIS — M9902 Segmental and somatic dysfunction of thoracic region: Secondary | ICD-10-CM | POA: Diagnosis not present

## 2018-11-28 DIAGNOSIS — M542 Cervicalgia: Secondary | ICD-10-CM | POA: Diagnosis not present

## 2018-12-19 DIAGNOSIS — K210 Gastro-esophageal reflux disease with esophagitis: Secondary | ICD-10-CM | POA: Diagnosis not present

## 2018-12-19 DIAGNOSIS — N3289 Other specified disorders of bladder: Secondary | ICD-10-CM | POA: Diagnosis not present

## 2018-12-19 DIAGNOSIS — J301 Allergic rhinitis due to pollen: Secondary | ICD-10-CM | POA: Diagnosis not present

## 2018-12-19 DIAGNOSIS — M25511 Pain in right shoulder: Secondary | ICD-10-CM | POA: Diagnosis not present

## 2018-12-19 DIAGNOSIS — F419 Anxiety disorder, unspecified: Secondary | ICD-10-CM | POA: Diagnosis not present

## 2018-12-19 DIAGNOSIS — E559 Vitamin D deficiency, unspecified: Secondary | ICD-10-CM | POA: Diagnosis not present

## 2018-12-19 DIAGNOSIS — N951 Menopausal and female climacteric states: Secondary | ICD-10-CM | POA: Diagnosis not present

## 2018-12-19 DIAGNOSIS — E785 Hyperlipidemia, unspecified: Secondary | ICD-10-CM | POA: Diagnosis not present

## 2018-12-19 DIAGNOSIS — E538 Deficiency of other specified B group vitamins: Secondary | ICD-10-CM | POA: Diagnosis not present

## 2018-12-19 DIAGNOSIS — Z6826 Body mass index (BMI) 26.0-26.9, adult: Secondary | ICD-10-CM | POA: Diagnosis not present

## 2018-12-19 DIAGNOSIS — G4700 Insomnia, unspecified: Secondary | ICD-10-CM | POA: Diagnosis not present

## 2019-04-14 DIAGNOSIS — Z79890 Hormone replacement therapy (postmenopausal): Secondary | ICD-10-CM | POA: Diagnosis not present

## 2019-04-14 DIAGNOSIS — Z1231 Encounter for screening mammogram for malignant neoplasm of breast: Secondary | ICD-10-CM | POA: Diagnosis not present

## 2019-05-20 DIAGNOSIS — Z20828 Contact with and (suspected) exposure to other viral communicable diseases: Secondary | ICD-10-CM | POA: Diagnosis not present

## 2019-05-20 DIAGNOSIS — R110 Nausea: Secondary | ICD-10-CM | POA: Diagnosis not present

## 2019-05-20 DIAGNOSIS — J0100 Acute maxillary sinusitis, unspecified: Secondary | ICD-10-CM | POA: Diagnosis not present

## 2019-06-17 DIAGNOSIS — S46812A Strain of other muscles, fascia and tendons at shoulder and upper arm level, left arm, initial encounter: Secondary | ICD-10-CM | POA: Diagnosis not present

## 2019-06-19 DIAGNOSIS — E538 Deficiency of other specified B group vitamins: Secondary | ICD-10-CM | POA: Diagnosis not present

## 2019-06-19 DIAGNOSIS — B029 Zoster without complications: Secondary | ICD-10-CM | POA: Diagnosis not present

## 2019-06-19 DIAGNOSIS — E559 Vitamin D deficiency, unspecified: Secondary | ICD-10-CM | POA: Diagnosis not present

## 2019-06-19 DIAGNOSIS — N3289 Other specified disorders of bladder: Secondary | ICD-10-CM | POA: Diagnosis not present

## 2019-06-19 DIAGNOSIS — G4700 Insomnia, unspecified: Secondary | ICD-10-CM | POA: Diagnosis not present

## 2019-06-19 DIAGNOSIS — F419 Anxiety disorder, unspecified: Secondary | ICD-10-CM | POA: Diagnosis not present

## 2019-06-19 DIAGNOSIS — J301 Allergic rhinitis due to pollen: Secondary | ICD-10-CM | POA: Diagnosis not present

## 2019-06-27 DIAGNOSIS — B029 Zoster without complications: Secondary | ICD-10-CM | POA: Diagnosis not present

## 2019-07-08 DIAGNOSIS — E663 Overweight: Secondary | ICD-10-CM | POA: Diagnosis not present

## 2019-07-08 DIAGNOSIS — Z6825 Body mass index (BMI) 25.0-25.9, adult: Secondary | ICD-10-CM | POA: Diagnosis not present

## 2019-07-08 DIAGNOSIS — B029 Zoster without complications: Secondary | ICD-10-CM | POA: Diagnosis not present

## 2019-08-12 DIAGNOSIS — B029 Zoster without complications: Secondary | ICD-10-CM | POA: Diagnosis not present

## 2019-08-12 DIAGNOSIS — L820 Inflamed seborrheic keratosis: Secondary | ICD-10-CM | POA: Diagnosis not present

## 2019-09-16 DIAGNOSIS — M797 Fibromyalgia: Secondary | ICD-10-CM | POA: Diagnosis not present

## 2019-09-16 DIAGNOSIS — M2550 Pain in unspecified joint: Secondary | ICD-10-CM | POA: Diagnosis not present

## 2019-09-16 DIAGNOSIS — E663 Overweight: Secondary | ICD-10-CM | POA: Diagnosis not present

## 2019-09-16 DIAGNOSIS — R768 Other specified abnormal immunological findings in serum: Secondary | ICD-10-CM | POA: Diagnosis not present

## 2019-09-16 DIAGNOSIS — Z6827 Body mass index (BMI) 27.0-27.9, adult: Secondary | ICD-10-CM | POA: Diagnosis not present

## 2019-12-30 DIAGNOSIS — M545 Low back pain: Secondary | ICD-10-CM | POA: Diagnosis not present

## 2019-12-30 DIAGNOSIS — M546 Pain in thoracic spine: Secondary | ICD-10-CM | POA: Diagnosis not present

## 2019-12-30 DIAGNOSIS — M9903 Segmental and somatic dysfunction of lumbar region: Secondary | ICD-10-CM | POA: Diagnosis not present

## 2019-12-30 DIAGNOSIS — M542 Cervicalgia: Secondary | ICD-10-CM | POA: Diagnosis not present

## 2019-12-30 DIAGNOSIS — M9901 Segmental and somatic dysfunction of cervical region: Secondary | ICD-10-CM | POA: Diagnosis not present

## 2019-12-30 DIAGNOSIS — M9902 Segmental and somatic dysfunction of thoracic region: Secondary | ICD-10-CM | POA: Diagnosis not present

## 2020-01-15 DIAGNOSIS — Z79899 Other long term (current) drug therapy: Secondary | ICD-10-CM | POA: Diagnosis not present

## 2020-01-15 DIAGNOSIS — N3289 Other specified disorders of bladder: Secondary | ICD-10-CM | POA: Diagnosis not present

## 2020-01-15 DIAGNOSIS — G4700 Insomnia, unspecified: Secondary | ICD-10-CM | POA: Diagnosis not present

## 2020-01-15 DIAGNOSIS — Z6827 Body mass index (BMI) 27.0-27.9, adult: Secondary | ICD-10-CM | POA: Diagnosis not present

## 2020-01-15 DIAGNOSIS — F419 Anxiety disorder, unspecified: Secondary | ICD-10-CM | POA: Diagnosis not present

## 2020-01-15 DIAGNOSIS — E785 Hyperlipidemia, unspecified: Secondary | ICD-10-CM | POA: Diagnosis not present

## 2020-01-15 DIAGNOSIS — R0602 Shortness of breath: Secondary | ICD-10-CM | POA: Diagnosis not present

## 2020-01-15 DIAGNOSIS — B079 Viral wart, unspecified: Secondary | ICD-10-CM | POA: Diagnosis not present

## 2020-01-15 DIAGNOSIS — Z23 Encounter for immunization: Secondary | ICD-10-CM | POA: Diagnosis not present

## 2020-01-15 DIAGNOSIS — E559 Vitamin D deficiency, unspecified: Secondary | ICD-10-CM | POA: Diagnosis not present

## 2020-01-15 DIAGNOSIS — R0789 Other chest pain: Secondary | ICD-10-CM | POA: Diagnosis not present

## 2020-01-28 DIAGNOSIS — F419 Anxiety disorder, unspecified: Secondary | ICD-10-CM | POA: Insufficient documentation

## 2020-01-28 DIAGNOSIS — K219 Gastro-esophageal reflux disease without esophagitis: Secondary | ICD-10-CM | POA: Insufficient documentation

## 2020-01-28 DIAGNOSIS — E538 Deficiency of other specified B group vitamins: Secondary | ICD-10-CM | POA: Insufficient documentation

## 2020-01-28 DIAGNOSIS — R1310 Dysphagia, unspecified: Secondary | ICD-10-CM | POA: Insufficient documentation

## 2020-01-28 DIAGNOSIS — M797 Fibromyalgia: Secondary | ICD-10-CM | POA: Insufficient documentation

## 2020-01-29 DIAGNOSIS — M546 Pain in thoracic spine: Secondary | ICD-10-CM | POA: Diagnosis not present

## 2020-01-29 DIAGNOSIS — M9903 Segmental and somatic dysfunction of lumbar region: Secondary | ICD-10-CM | POA: Diagnosis not present

## 2020-01-29 DIAGNOSIS — M542 Cervicalgia: Secondary | ICD-10-CM | POA: Diagnosis not present

## 2020-01-29 DIAGNOSIS — M9902 Segmental and somatic dysfunction of thoracic region: Secondary | ICD-10-CM | POA: Diagnosis not present

## 2020-01-29 DIAGNOSIS — M545 Low back pain, unspecified: Secondary | ICD-10-CM | POA: Diagnosis not present

## 2020-01-29 DIAGNOSIS — M9901 Segmental and somatic dysfunction of cervical region: Secondary | ICD-10-CM | POA: Diagnosis not present

## 2020-02-03 DIAGNOSIS — Z1231 Encounter for screening mammogram for malignant neoplasm of breast: Secondary | ICD-10-CM | POA: Diagnosis not present

## 2020-02-05 NOTE — Progress Notes (Signed)
Cardiology Office Note:    Date:  02/06/2020   ID:  Chelsea Bennett, DOB 03/01/1961, MRN [id number]  PCP:  Chelsea Coop, FNP  Cardiologist:  Shirlee More, MD   Referring MD: Chelsea Coop, FNP  ASSESSMENT:    1. Exertional chest pain    PLAN:    In order of problems listed above:  1. Her differential diagnosis includes microvascular CAD cardiomyopathy especially with a provokable left ventricular outflow tract gradient pulmonary artery hypertension or the potential of exercise-induced asthma.  For further evaluation cardiac CTA to look a calcium score presence or absence of CAD and echocardiogram to assess for cardiomyopathy and pulmonary artery hypertension.  If these are normal then I think she should undergo a trial of bronchodilator before physical activity.  She may benefit from a cardiopulmonary stress test.  Next appointment 6 weeks   Medication Adjustments/Labs and Tests Ordered: Current medicines are reviewed at length with the patient today.  Concerns regarding medicines are outlined above.  No orders of the defined types were placed in this encounter.  No orders of the defined types were placed in this encounter.    Chief Complaint  Patient presents with  . Chest Pain    History of Present Illness:    Chelsea Bennett is a 59 y.o. female who is being seen today for the evaluation of chest pain and shortness of breath at the request of Chelsea Coop, FNP.  Chart review shows that she was seen by [facility] cardiology 2018 for chest pain she had undergone coronary arteriography which was normal. At that time she was noted to have bronchospasm.  She had a treadmill stress test performed 03/28/2016 that showed a normal exercise capacity 6 minutes her EKG response was normal but she had chest pain with physical activity and was advised coronary angiography.  Chest x-ray at that time 04/07/2016 was normal.  Left heart cath 04/13/2016:Diagnostic Procedure  Summary  Normal coronary arteries.  No LV gram done.  Diagnostic Procedure Recommendations  Recommend work up for non-cardiac causes of symptoms.  Signatures  Electronically signed by [name], MD(Diagnostic Physician)  on 04/13/2016 10:17  Angiographic findings  Cardiac Arteries and Lesion Findings  LMCA: Normal appearance with 0% stenosis.  LAD: Normal appearance with 0% stenosis.  LCx: Normal appearance with 0% stenosis.  RCA: Normal appearance with 0% stenosis.  Ramus: Normal appearance with 0% stenosis  I reviewed her office note PCP with chronic problems of anxiety B12 deficiency and insomnia.  During the visit there were complaints of shortness of breath and chest pain and is referred for evaluation  Most recent lab work 09/03/2018 shows a normal CMP potassium 4.3 GFR 102 cc TSH normal 2.32 CBC was normal cholesterol 182 LDL 82 triglyceride 357 HDL 29 her recent tests show cholesterol 198 LDL 125 triglyceride 210 HDL 35 D-dimer was quite low at 26 and proBNP level was quite low at 19.  We both recognize each other I cared for her brother who had severe CAD and a cardiomyopathy and she told me that he died last Christmas of pulmonary embolism.  She has a long pattern of very typical angina when she walks with her husband in the morning she gets precordial discomfort tightness radiates into the left breast and she is able to walk through it by slowing down is predictable does not happen at other times and she has no wheezing.  She is not short of breath with other activities no edema  orthopnea.  She has had this pattern for years and is frustrated and that there is no answer for what is wrong.  She asked if she could have other heart disease like cardio myopathy.  Her chest pain is moderate exertional substernal left chest improves when she slows down is able to walk through and associated with shortness of breath its not pleuritic no associated nausea or vomiting or diaphoresis.   It occurs when she walks with her husband every morning 2 miles but also she walks briskly upstairs. Past Medical History:  Diagnosis Date  . Abnormal stress test 04/06/2016   Formatting of this note might be different from the original. Added automatically from request for surgery [id number] Formatting of this note might be different from the original. Overview:  Added automatically from request for surgery [id number]  . Anxiety   . Dysphagia   . Exertional chest pain 04/06/2016   Formatting of this note might be different from the original. Added automatically from request for surgery [id number] Formatting of this note might be different from the original. Overview:  Added automatically from request for surgery [id number]  . Fibromyalgia   . GERD (gastroesophageal reflux disease)   . Vitamin B12 deficiency     Past Surgical History:  Procedure Laterality Date  . ABDOMINAL HYSTERECTOMY    . APPENDECTOMY    . CATARACT EXTRACTION    . CESAREAN SECTION    . CHOLECYSTECTOMY    . COLONOSCOPY  12/13/2011   Mild sigmoid diverticulosis. Small internal hemorrhoids. Otherwise noraml TI.   Marland Kitchen ESOPHAGOGASTRODUODENOSCOPY  12/13/2011   Minimal hiatal hernia. Mild gastrtiis.   Marland Kitchen HEMORRHOID SURGERY    . SHOULDER SURGERY      Current Medications: Current Meds  Medication Sig  . ALPRAZolam (XANAX) 0.5 MG tablet Take 0.5 mg by mouth daily as needed.  . cetirizine (ZYRTEC) 10 MG tablet Take 10 mg by mouth daily as needed.  . Cholecalciferol (VITAMIN D3) 2000 units TABS Take by mouth.  . cyanocobalamin (,VITAMIN B-12,) 1000 MCG/ML injection every 30 (thirty) days.  Marland Kitchen esomeprazole (NEXIUM) 20 MG packet Take 20 mg by mouth daily before breakfast.  . Eszopiclone 3 MG TABS Take 3 mg by mouth at bedtime.  Marland Kitchen ibuprofen (ADVIL) 200 MG tablet Take 200 mg by mouth every 6 (six) hours as needed.  . methocarbamol (ROBAXIN) 750 MG tablet TAKE ONE TABLET BY MOUTH THREE TIMES DAILY  . NON FORMULARY daily.  . NON FORMULARY  1,250 mg daily.   . NON FORMULARY Progesterone SR 100 mg      Take 2 capsules nightly  . NON FORMULARY Biest / Dhea                  1 gel daily  . oxybutynin (DITROPAN) 5 MG tablet TAKE ONE TABLET BY MOUTH TWICE DAILY AS NEEDED for bladder spasm  . traMADol (ULTRAM) 50 MG tablet Take 50 mg by mouth every 6 (six) hours as needed.    Current Facility-Administered Medications for the 02/06/20 encounter (Office Visit) with Richardo Priest, MD  Medication  . 0.9 %  sodium chloride infusion     Allergies:   Aminophylline, Amitriptyline, Citalopram, Clarithromycin, Fluoxetine, Latex, Olanzapine, Oxycodone, Paroxetine hcl, Tioconazole, Triamterene, and Triazolam   Social History   Socioeconomic History  . Marital status: Married    Spouse name: Not on file  . Number of children: Not on file  . Years of education: Not on file  . Highest education level: Not  on file  Occupational History  . Not on file  Tobacco Use  . Smoking status: Never Smoker  . Smokeless tobacco: Never Used  Vaping Use  . Vaping Use: Never used  Substance and Sexual Activity  . Alcohol use: Never  . Drug use: Never  . Sexual activity: Not on file  Other Topics Concern  . Not on file  Social History Narrative  . Not on file   Social Determinants of Health   Financial Resource Strain:   . Difficulty of Paying Living Expenses: Not on file  Food Insecurity:   . Worried About Charity fundraiser in the Last Year: Not on file  . Ran Out of Food in the Last Year: Not on file  Transportation Needs:   . Lack of Transportation (Medical): Not on file  . Lack of Transportation (Non-Medical): Not on file  Physical Activity:   . Days of Exercise per Week: Not on file  . Minutes of Exercise per Session: Not on file  Stress:   . Feeling of Stress : Not on file  Social Connections:   . Frequency of Communication with Friends and Family: Not on file  . Frequency of Social Gatherings with Friends and Family: Not on file   . Attends Religious Services: Not on file  . Active Member of Clubs or Organizations: Not on file  . Attends Archivist Meetings: Not on file  . Marital Status: Not on file     Family History: The patient's family history includes Pancreatic cancer in her mother; Prostate cancer in her father. There is no history of Colon cancer, Stomach cancer, Rectal cancer, or Esophageal cancer.  ROS:   ROS Please see the history of present illness.     All other systems reviewed and are negative.  EKGs/Labs/Other Studies Reviewed:    The following studies were reviewed today:   EKG:  EKG is  ordered today.  The ekg ordered today is personally reviewed and demonstrates shows sinus rhythm and her EKG is normal  Recent Labs: No results found for requested labs within last 8760 hours.  Recent Lipid Panel No results found for: CHOL, TRIG, HDL, CHOLHDL, VLDL, LDLCALC, LDLDIRECT  Physical Exam:    VS:  BP 125/84   Pulse 89   Ht 5' 3.5" (1.613 m)   Wt 163 lb 9.6 oz (74.2 kg)   SpO2 94%   BMI 28.53 kg/m     Wt Readings from Last 3 Encounters:  02/06/20 163 lb 9.6 oz (74.2 kg)  08/08/17 166 lb (75.3 kg)  08/07/17 166 lb (75.3 kg)     GEN:  Well nourished, well developed in no acute distress HEENT: Normal NECK: No JVD; No carotid bruits LYMPHATICS: No lymphadenopathy CARDIAC: RRR, no murmurs, rubs, gallops RESPIRATORY:  Clear to auscultation without rales, wheezing or rhonchi  ABDOMEN: Soft, non-tender, non-distended MUSCULOSKELETAL:  No edema; No deformity  SKIN: Warm and dry NEUROLOGIC:  Alert and oriented x 3 PSYCHIATRIC:  Normal affect     Signed, Shirlee More, MD  02/06/2020 2:53 PM    [place]

## 2020-02-06 ENCOUNTER — Encounter: Payer: Self-pay | Admitting: Cardiology

## 2020-02-06 ENCOUNTER — Other Ambulatory Visit: Payer: Self-pay

## 2020-02-06 ENCOUNTER — Ambulatory Visit: Payer: PPO | Admitting: Cardiology

## 2020-02-06 VITALS — BP 125/84 | HR 89 | Ht 63.5 in | Wt 163.6 lb

## 2020-02-06 DIAGNOSIS — R072 Precordial pain: Secondary | ICD-10-CM

## 2020-02-06 DIAGNOSIS — R8290 Unspecified abnormal findings in urine: Secondary | ICD-10-CM | POA: Diagnosis not present

## 2020-02-06 DIAGNOSIS — R3915 Urgency of urination: Secondary | ICD-10-CM | POA: Diagnosis not present

## 2020-02-06 DIAGNOSIS — R079 Chest pain, unspecified: Secondary | ICD-10-CM

## 2020-02-06 MED ORDER — METOPROLOL TARTRATE 100 MG PO TABS: 100.0000 mg | ORAL_TABLET | Freq: Once | ORAL | 0 refills | Status: DC

## 2020-02-06 NOTE — Patient Instructions (Addendum)
Medication Instructions:  No medication changes. *If you need a refill on your cardiac medications before your next appointment, please call your pharmacy*   Lab Work: Your physician recommends that you return for lab work in: 1 week prior to your CT. You can come Monday through Friday 8:30 am to 12:00 pm and 1:15 to 4:30. You do not need to make an appointment as the order has already been placed.   If you have labs (blood work) drawn today and your tests are completely normal, you will receive your results only by:  [place] (if you have MyChart) OR  A paper copy in the mail If you have any lab test that is abnormal or we need to change your treatment, we will call you to review the results.   Testing/Procedures: Your cardiac CT will be scheduled at:   [facility] [place], [place] [address] [phone]   If scheduled at [facility], please arrive at the [facility] main entrance of [facility] 30 minutes prior to test start time. Proceed to the [facility] Radiology Department (first floor) to check-in and test prep.  Please follow these instructions carefully (unless otherwise directed):  On the Night Before the Test:  Be sure to Drink plenty of water.  Do not consume any caffeinated/decaffeinated beverages or chocolate 12 hours prior to your test.  Do not take any antihistamines 12 hours prior to your test.   On the Day of the Test:  Drink plenty of water. Do not drink any water within one hour of the test.  Do not eat any food 4 hours prior to the test.  You may take your regular medications prior to the test.   Take metoprolol (Lopressor) two hours prior to test.  FEMALES- please wear underwire-free bra if available      After the Test:  Drink plenty of water.  After receiving IV contrast, you may experience a mild flushed feeling. This is normal.  On occasion, you may experience a mild rash up to 24  hours after the test. This is not dangerous. If this occurs, you can take Benadryl 25 mg and increase your fluid intake.  If you experience trouble breathing, this can be serious. If it is severe call 911 IMMEDIATELY. If it is mild, please call our office.  If you take any of these medications: Glipizide/Metformin, Avandament, Glucavance, please do not take 48 hours after completing test unless otherwise instructed.   Once we have confirmed authorization from your insurance company, we will call you to set up a date and time for your test. Based on how quickly your insurance processes prior authorizations requests, please allow up to 4 weeks to be contacted for scheduling your Cardiac CT appointment. Be advised that routine Cardiac CT appointments could be scheduled as many as 8 weeks after your provider has ordered it.  For non-scheduling related questions, please contact the cardiac imaging nurse navigator should you have any questions/concerns: Marchia Bond, Cardiac Imaging Nurse Navigator Burley Saver, Interim Cardiac Imaging Nurse [facility] Direct Office Dial: [phone]   For scheduling needs, including cancellations and rescheduling, please call Vivien Rota at [phone].    Your physician has requested that you have an echocardiogram. Echocardiography is a painless test that uses sound waves to create images of your heart. It provides your doctor with information about the size and shape of your heart and how well your hearts chambers and  valves are working. This procedure takes approximately one hour. There are no restrictions for this procedure.    Follow-Up: At [facility], you and your health needs are our priority.  As part of our continuing mission to provide you with exceptional heart care, we have created designated Provider Care Teams.  These Care Teams include your primary Cardiologist (physician) and Advanced Practice Providers (APPs -   Physician Assistants and Nurse Practitioners) who all work together to provide you with the care you need, when you need it.  We recommend signing up for the patient portal called "MyChart".  Sign up information is provided on this After Visit Summary.  MyChart is used to connect with patients for Virtual Visits (Telemedicine).  Patients are able to view lab/test results, encounter notes, upcoming appointments, etc.  Non-urgent messages can be sent to your provider as well.   To learn more about what you can do with MyChart, go to [email or web address].    Your next appointment:   6 week(s)  The format for your next appointment:   In Person  Provider:   Shirlee More, MD   Other Instructions Cardiac CT Angiogram A cardiac CT angiogram is a procedure to look at the heart and the area around the heart. It may be done to help find the cause of chest pains or other symptoms of heart disease. During this procedure, a substance called contrast dye is injected into the blood vessels in the area to be checked. A large X-ray machine, called a CT scanner, then takes detailed pictures of the heart and the surrounding area. The procedure is also sometimes called a coronary CT angiogram, coronary artery scanning, or CTA. A cardiac CT angiogram allows the health care provider to see how well blood is flowing to and from the heart. The health care provider will be able to see if there are any problems, such as:  Blockage or narrowing of the coronary arteries in the heart.  Fluid around the heart.  Signs of weakness or disease in the muscles, valves, and tissues of the heart. Tell a health care provider about:  Any allergies you have. This is especially important if you have had a previous allergic reaction to contrast dye.  All medicines you are taking, including vitamins, herbs, eye drops, creams, and over-the-counter medicines.  Any blood disorders you have.  Any surgeries you have had.  Any  medical conditions you have.  Whether you are pregnant or may be pregnant.  Any anxiety disorders, chronic pain, or other conditions you have that may increase your stress or prevent you from lying still. What are the risks? Generally, this is a safe procedure. However, problems may occur, including: 1. Bleeding. 2. Infection. 3. Allergic reactions to medicines or dyes. 4. Damage to other structures or organs. 5. Kidney damage from the contrast dye that is used. 6. Increased risk of cancer from radiation exposure. This risk is low. Talk with your health care provider about: ? The risks and benefits of testing. ? How you can receive the lowest dose of radiation. What happens before the procedure? 1. Wear comfortable clothing and remove any jewelry, glasses, dentures, and hearing aids. 2. Follow instructions from your health care provider about eating and drinking. This may include: ? For 12 hours before the procedure -- avoid caffeine. This includes tea, coffee, soda, energy drinks, and diet pills. Drink plenty of water or other fluids that do not have caffeine in them. Being well hydrated can prevent complications. ?  For 4-6 hours before the procedure -- stop eating and drinking. The contrast dye can cause nausea, but this is less likely if your stomach is empty. 3. Ask your health care provider about changing or stopping your regular medicines. This is especially important if you are taking diabetes medicines, blood thinners, or medicines to treat problems with erections (erectile dysfunction). What happens during the procedure?  1. Hair on your chest may need to be removed so that small sticky patches called electrodes can be placed on your chest. These will transmit information that helps to monitor your heart during the procedure. 2. An IV will be inserted into one of your veins. 3. You might be given a medicine to control your heart rate during the procedure. This will help to ensure  that good images are obtained. 4. You will be asked to lie on an exam table. This table will slide in and out of the CT machine during the procedure. 5. Contrast dye will be injected into the IV. You might feel warm, or you may get a metallic taste in your mouth. 6. You will be given a medicine called nitroglycerin. This will relax or dilate the arteries in your heart. 7. The table that you are lying on will move into the CT machine tunnel for the scan. 8. The person running the machine will give you instructions while the scans are being done. You may be asked to: ? Keep your arms above your head. ? Hold your breath. ? Stay very still, even if the table is moving. 9. When the scanning is complete, you will be moved out of the machine. 10. The IV will be removed. The procedure may vary among health care providers and hospitals. What can I expect after the procedure? After your procedure, it is common to have:  A metallic taste in your mouth from the contrast dye.  A feeling of warmth.  A headache from the nitroglycerin. Follow these instructions at home:  Take over-the-counter and prescription medicines only as told by your health care provider.  If you are told, drink enough fluid to keep your urine pale yellow. This will help to flush the contrast dye out of your body.  Most people can return to their normal activities right after the procedure. Ask your health care provider what activities are safe for you.  It is up to you to get the results of your procedure. Ask your health care provider, or the department that is doing the procedure, when your results will be ready.  Keep all follow-up visits as told by your health care provider. This is important. Contact a health care provider if: 1. You have any symptoms of allergy to the contrast dye. These include: ? Shortness of breath. ? Rash or hives. ? A racing heartbeat. Summary  A cardiac CT angiogram is a procedure to look at  the heart and the area around the heart. It may be done to help find the cause of chest pains or other symptoms of heart disease.  During this procedure, a large X-ray machine, called a CT scanner, takes detailed pictures of the heart and the surrounding area after a contrast dye has been injected into blood vessels in the area.  Ask your health care provider about changing or stopping your regular medicines before the procedure. This is especially important if you are taking diabetes medicines, blood thinners, or medicines to treat erectile dysfunction.  If you are told, drink enough fluid to keep your  urine pale yellow. This will help to flush the contrast dye out of your body. This information is not intended to replace advice given to you by your health care provider. Make sure you discuss any questions you have with your health care provider. Document Revised: 11/13/2018 Document Reviewed: 11/13/2018 Elsevier Patient Education  [place].   Echocardiogram An echocardiogram is a procedure that uses painless sound waves (ultrasound) to produce an image of the heart. Images from an echocardiogram can provide important information about:  Signs of coronary artery disease (CAD).  Aneurysm detection. An aneurysm is a weak or damaged part of an artery wall that bulges out from the normal force of blood pumping through the body.  Heart size and shape. Changes in the size or shape of the heart can be associated with certain conditions, including heart failure, aneurysm, and CAD.  Heart muscle function.  Heart valve function.  Signs of a past heart attack.  Fluid buildup around the heart.  Thickening of the heart muscle.  A tumor or infectious growth around the heart valves. Tell a health care provider about:  Any allergies you have.  All medicines you are taking, including vitamins, herbs, eye drops, creams, and over-the-counter medicines.  Any blood disorders you  have.  Any surgeries you have had.  Any medical conditions you have.  Whether you are pregnant or may be pregnant. What are the risks? Generally, this is a safe procedure. However, problems may occur, including:  Allergic reaction to dye (contrast) that may be used during the procedure. What happens before the procedure? No specific preparation is needed. You may eat and drink normally. What happens during the procedure?   An IV tube may be inserted into one of your veins.  You may receive contrast through this tube. A contrast is an injection that improves the quality of the pictures from your heart.  A gel will be applied to your chest.  A wand-like tool (transducer) will be moved over your chest. The gel will help to transmit the sound waves from the transducer.  The sound waves will harmlessly bounce off of your heart to allow the heart images to be captured in real-time motion. The images will be recorded on a computer. The procedure may vary among health care providers and hospitals. What happens after the procedure?  You may return to your normal, everyday life, including diet, activities, and medicines, unless your health care provider tells you not to do that. Summary  An echocardiogram is a procedure that uses painless sound waves (ultrasound) to produce an image of the heart.  Images from an echocardiogram can provide important information about the size and shape of your heart, heart muscle function, heart valve function, and fluid buildup around your heart.  You do not need to do anything to prepare before this procedure. You may eat and drink normally.  After the echocardiogram is completed, you may return to your normal, everyday life, unless your health care provider tells you not to do that. This information is not intended to replace advice given to you by your health care provider. Make sure you discuss any questions you have with your health care  provider. Document Revised: 07/11/2018 Document Reviewed: 04/22/2016 Elsevier Patient Education  [place].

## 2020-02-11 ENCOUNTER — Other Ambulatory Visit: Payer: Self-pay | Admitting: Cardiology

## 2020-02-13 DIAGNOSIS — R1032 Left lower quadrant pain: Secondary | ICD-10-CM | POA: Diagnosis not present

## 2020-02-13 DIAGNOSIS — G8929 Other chronic pain: Secondary | ICD-10-CM | POA: Diagnosis not present

## 2020-02-13 DIAGNOSIS — M545 Low back pain, unspecified: Secondary | ICD-10-CM | POA: Diagnosis not present

## 2020-02-13 DIAGNOSIS — Z6828 Body mass index (BMI) 28.0-28.9, adult: Secondary | ICD-10-CM | POA: Diagnosis not present

## 2020-02-17 DIAGNOSIS — L820 Inflamed seborrheic keratosis: Secondary | ICD-10-CM | POA: Diagnosis not present

## 2020-02-17 DIAGNOSIS — L918 Other hypertrophic disorders of the skin: Secondary | ICD-10-CM | POA: Diagnosis not present

## 2020-03-01 ENCOUNTER — Other Ambulatory Visit: Payer: Self-pay

## 2020-03-01 ENCOUNTER — Ambulatory Visit (INDEPENDENT_AMBULATORY_CARE_PROVIDER_SITE_OTHER): Payer: PPO

## 2020-03-01 ENCOUNTER — Telehealth: Payer: Self-pay

## 2020-03-01 DIAGNOSIS — R079 Chest pain, unspecified: Secondary | ICD-10-CM | POA: Diagnosis not present

## 2020-03-01 LAB — ECHOCARDIOGRAM COMPLETE
Area-P 1/2: 3.6 cm2
Calc EF: 59.4 %
S' Lateral: 2.6 cm
Single Plane A2C EF: 56.6 %
Single Plane A4C EF: 58.6 %

## 2020-03-01 LAB — BASIC METABOLIC PANEL
BUN/Creatinine Ratio: 25 — ABNORMAL HIGH (ref 9–23)
BUN: 15 mg/dL (ref 6–24)
CO2: 23 mmol/L (ref 20–29)
Calcium: 9.1 mg/dL (ref 8.7–10.2)
Chloride: 104 mmol/L (ref 96–106)
Creatinine, Ser: 0.61 mg/dL (ref 0.57–1.00)
GFR calc Af Amer: 116 mL/min/{1.73_m2} (ref >59)
GFR calc non Af Amer: 100 mL/min/{1.73_m2} (ref >59)
Glucose: 79 mg/dL (ref 65–99)
Potassium: 4.1 mmol/L (ref 3.5–5.2)
Sodium: 139 mmol/L (ref 134–144)

## 2020-03-01 NOTE — Progress Notes (Signed)
Complete echocardiogram performed.  Jimmy Linde Wilensky RDCS, RVT  

## 2020-03-01 NOTE — Telephone Encounter (Signed)
-----   Message from Richardo Priest, MD sent at 03/01/2020 12:25 PM EST ----- Good result echocardiogram is normal.

## 2020-03-01 NOTE — Telephone Encounter (Signed)
Spoke with patient regarding results and recommendation.  Patient verbalizes understanding and is agreeable to plan of care. Advised patient to call back with any issues or concerns.  

## 2020-03-05 ENCOUNTER — Telehealth (HOSPITAL_COMMUNITY): Payer: Self-pay | Admitting: Emergency Medicine

## 2020-03-05 NOTE — Telephone Encounter (Signed)
Reaching out to patient to offer assistance regarding upcoming cardiac imaging study; pt verbalizes understanding of appt date/time, parking situation and where to check in, pre-test NPO status and medications ordered, and verified current allergies; name and call back number provided for further questions should they arise Marchia Bond RN Navigator Cardiac Imaging [facility] [phone] office [phone] cell   Pt to take 100mg  metoprolol tartrate 2 hr prior to scan. Holding zyrtec. Clarise Cruz

## 2020-03-08 ENCOUNTER — Ambulatory Visit (HOSPITAL_COMMUNITY)
Admission: RE | Admit: 2020-03-08 | Discharge: 2020-03-08 | Disposition: A | Discharge status: Home / Self Care | Payer: PPO | Source: Ambulatory Visit | Attending: Cardiology | Admitting: Cardiology

## 2020-03-08 ENCOUNTER — Other Ambulatory Visit: Payer: Self-pay

## 2020-03-08 DIAGNOSIS — R072 Precordial pain: Secondary | ICD-10-CM | POA: Diagnosis not present

## 2020-03-08 DIAGNOSIS — R079 Chest pain, unspecified: Secondary | ICD-10-CM

## 2020-03-08 MED ORDER — NITROGLYCERIN 0.4 MG SL SUBL: 0.8000 mg | SUBLINGUAL_TABLET | Freq: Once | SUBLINGUAL | Status: AC

## 2020-03-08 MED ORDER — NITROGLYCERIN 0.4 MG SL SUBL
SUBLINGUAL_TABLET | SUBLINGUAL | Status: AC
  Administered 2020-03-08: 0.8 mg via SUBLINGUAL
  Filled 2020-03-08: qty 2

## 2020-03-08 MED ORDER — IOHEXOL 350 MG/ML SOLN
80.0000 mL | Freq: Once | INTRAVENOUS | Status: AC | PRN
  Administered 2020-03-08: 80 mL via INTRAVENOUS

## 2020-03-09 ENCOUNTER — Telehealth: Payer: Self-pay

## 2020-03-09 NOTE — Telephone Encounter (Signed)
-----   Message from Richardo Priest, MD sent at 03/08/2020  3:39 PM EST ----- This is the best result CT of the chest is normal Her calcium score which is atherosclerosis is 0 Coronary arteries are normal.

## 2020-03-09 NOTE — Telephone Encounter (Signed)
Spoke with patient regarding results and recommendation.  Patient verbalizes understanding and is agreeable to plan of care. Advised patient to call back with any issues or concerns.  

## 2020-03-16 ENCOUNTER — Ambulatory Visit: Payer: Self-pay | Admitting: Sports Medicine

## 2020-03-17 NOTE — Progress Notes (Signed)
Cardiology Office Note:    Date:  03/18/2020   ID:  Chelsea Bennett, DOB [birth year], MRN [id number]  PCP:  Chelsea Coop, FNP  Cardiologist:  Chelsea More, MD    Referring MD: Chelsea Coop, FNP    ASSESSMENT:    1. Other chest pain   2. Gastroesophageal reflux disease, unspecified whether esophagitis present    PLAN:    In order of problems listed above:  1. Cardiac studies are normal symptoms are noncardiac she has known esophageal disease is having increased symptoms from reflux despite a PPI sensation of pill sticking I Minna place her on Carafate at bedtime along with her PPI and encouraged her to follow-up with GI as she tells me that she had esophageal dilation approximately 2 years ago   Next appointment: I will see back in the office as needed   Medication Adjustments/Labs and Tests Ordered: Current medicines are reviewed at length with the patient today.  Concerns regarding medicines are outlined above.  No orders of the defined types were placed in this encounter.  No orders of the defined types were placed in this encounter.   Chief Complaint  Patient presents with  . Follow-up    After recent cardiac echo and cardiac CTA    History of Present Illness:    Chelsea Bennett is a 59 y.o. female with a hx of exertional chest pain with previous normal coronary Chelsea Bennett in 2018 last seen by me 02/06/2020.  Compliance with diet, lifestyle and medications: Yes  She takes PPI twice a day and continues to have reflux symptoms and a sensation of pills sticking in her esophagus.  She has no evidence of cardiac disease and I suspect her symptoms are esophageal in nature.  I placed her on Carafate at bedtime along with her PPI and encouraged her to follow-up with GI.  Her husband was present participate in evaluation and decision making.  She underwent cardiac CTA reported at which 03/08/2020 for calcium score of 0 no normal coronary origin in the absence of  coronary artery disease.  The aorta was normal in size.  There is minimal aortic atherosclerosis and over read of the chest CT showed no significant incidental noncardiac findings.  Echocardiogram 03/01/2020 showed no findings of cardiomyopathy and no left ventricular outflow tract obstruction.  There is no significant valvular abnormality.  Ejection fraction 60 to 65% with normal diastolic filling pressure. Past Medical History:  Diagnosis Date  . Abnormal stress test 04/06/2016   Formatting of this note might be different from the original. Added automatically from request for surgery [id number] Formatting of this note might be different from the original. Overview:  Added automatically from request for surgery [phone]  . Anxiety   . Dysphagia   . Exertional chest pain 04/06/2016   Formatting of this note might be different from the original. Added automatically from request for surgery [id number] Formatting of this note might be different from the original. Overview:  Added automatically from request for surgery [phone]  . Fibromyalgia   . GERD (gastroesophageal reflux disease)   . Vitamin B12 deficiency     Past Surgical History:  Procedure Laterality Date  . ABDOMINAL HYSTERECTOMY    . APPENDECTOMY    . CATARACT EXTRACTION    . CESAREAN SECTION    . CHOLECYSTECTOMY    . COLONOSCOPY  12/13/2011   Mild sigmoid diverticulosis. Small internal hemorrhoids. Otherwise noraml TI.   Marland Kitchen ESOPHAGOGASTRODUODENOSCOPY  12/13/2011   Minimal  hiatal hernia. Mild gastrtiis.   Marland Kitchen HEMORRHOID SURGERY    . SHOULDER SURGERY      Current Medications: Current Meds  Medication Sig  . ALPRAZolam (XANAX) 0.5 MG tablet Take 0.5 mg by mouth daily as needed.  . cetirizine (ZYRTEC) 10 MG tablet Take 10 mg by mouth daily as needed.  . Cholecalciferol (VITAMIN D3) 2000 units TABS Take by mouth.  . cyanocobalamin (,VITAMIN B-12,) 1000 MCG/ML injection every 30 (thirty) days.  Marland Kitchen esomeprazole (NEXIUM) 20 MG packet Take 20  mg by mouth daily before breakfast.  . Eszopiclone 3 MG TABS Take 3 mg by mouth at bedtime.  Marland Kitchen ibuprofen (ADVIL) 200 MG tablet Take 200 mg by mouth every 6 (six) hours as needed.  . methocarbamol (ROBAXIN) 750 MG tablet TAKE ONE TABLET BY MOUTH THREE TIMES DAILY  . NON FORMULARY daily.  . NON FORMULARY 1,250 mg daily.   . NON FORMULARY Progesterone SR 100 mg      Take 2 capsules nightly  . NON FORMULARY Biest / Dhea                  1 gel daily  . oxybutynin (DITROPAN) 5 MG tablet TAKE ONE TABLET BY MOUTH TWICE DAILY AS NEEDED for bladder spasm   Current Facility-Administered Medications for the 03/18/20 encounter (Office Visit) with Richardo Priest, MD  Medication  . 0.9 %  sodium chloride infusion     Allergies:   Aminophylline, Amitriptyline, Citalopram, Clarithromycin, Fluoxetine, Latex, Olanzapine, Oxycodone, Paroxetine hcl, Tioconazole, Triamterene, and Triazolam   Social History   Socioeconomic History  . Marital status: Married    Spouse name: Not on file  . Number of children: Not on file  . Years of education: Not on file  . Highest education level: Not on file  Occupational History  . Not on file  Tobacco Use  . Smoking status: Never Smoker  . Smokeless tobacco: Never Used  Vaping Use  . Vaping Use: Never used  Substance and Sexual Activity  . Alcohol use: Never  . Drug use: Never  . Sexual activity: Not on file  Other Topics Concern  . Not on file  Social History Narrative  . Not on file   Social Determinants of Health   Financial Resource Strain: Not on file  Food Insecurity: Not on file  Transportation Needs: Not on file  Physical Activity: Not on file  Stress: Not on file  Social Connections: Not on file     Family History: The patient's family history includes Pancreatic cancer in her mother; Prostate cancer in her father. There is no history of Colon cancer, Stomach cancer, Rectal cancer, or Esophageal cancer. ROS:   Please see the history of  present illness.    All other systems reviewed and are negative.  EKGs/Labs/Other Studies Reviewed:    The following studies were reviewed today:   Recent Labs: 03/01/2020: BUN 15; Creatinine, Ser 0.61; Potassium 4.1; Sodium 139    Physical Exam:    VS:  BP 132/85   Pulse 88   Ht 5' 3.5" (1.613 m)   Wt 164 lb 12.8 oz (74.8 kg)   SpO2 98%   BMI 28.74 kg/m     Wt Readings from Last 3 Encounters:  03/18/20 164 lb 12.8 oz (74.8 kg)  02/06/20 163 lb 9.6 oz (74.2 kg)  08/08/17 166 lb (75.3 kg)     GEN:  Well nourished, well developed in no acute distress HEENT: Normal NECK: No JVD;  No carotid bruits LYMPHATICS: No lymphadenopathy CARDIAC: RRR, no murmurs, rubs, gallops RESPIRATORY:  Clear to auscultation without rales, wheezing or rhonchi  ABDOMEN: Soft, non-tender, non-distended MUSCULOSKELETAL:  No edema; No deformity  SKIN: Warm and dry NEUROLOGIC:  Alert and oriented x 3 PSYCHIATRIC:  Normal affect    Signed, Chelsea More, MD  03/18/2020 1:58 PM    [facility]

## 2020-03-18 ENCOUNTER — Telehealth: Payer: Self-pay | Admitting: Gastroenterology

## 2020-03-18 ENCOUNTER — Ambulatory Visit: Payer: PPO | Admitting: Cardiology

## 2020-03-18 ENCOUNTER — Other Ambulatory Visit: Payer: Self-pay

## 2020-03-18 ENCOUNTER — Other Ambulatory Visit: Payer: Self-pay | Admitting: Gastroenterology

## 2020-03-18 VITALS — BP 132/85 | HR 88 | Ht 63.5 in | Wt 164.8 lb

## 2020-03-18 DIAGNOSIS — R0789 Other chest pain: Secondary | ICD-10-CM | POA: Diagnosis not present

## 2020-03-18 DIAGNOSIS — K219 Gastro-esophageal reflux disease without esophagitis: Secondary | ICD-10-CM

## 2020-03-18 DIAGNOSIS — R1310 Dysphagia, unspecified: Secondary | ICD-10-CM

## 2020-03-18 MED ORDER — SUCRALFATE 1 GM/10ML PO SUSP: 1.0000 g | Freq: Every day | ORAL | 3 refills | Status: DC

## 2020-03-18 NOTE — Telephone Encounter (Signed)
Dr. Bettina Gavia sent a message Having worsening esophageal spasms Has put her on Carafate  [place], If she is having dysphagia or if previous dilation in 2019 helped, please set her up for EGD with dilation-next available LEC slot Also if you could call her next week to see how she is doing If still with problems, give her a trial of GI cocktail   RG

## 2020-03-18 NOTE — Telephone Encounter (Signed)
Spoke to patient to schedule EGD/Dilation for 04/29/20 at [place] [facility]. I will contact her next week to see how she is feeling. If still witj problems will start on GI Cocktail. Instruction have been mailed to patient she will contact the office with any questions. Patient knows to return the consent tform back the day of procedure.

## 2020-03-18 NOTE — Patient Instructions (Signed)
Medication Instructions:  Your physician has recommended you make the following change in your medication:  START: Sucralfate 10 ml daily at bedtime.  *If you need a refill on your cardiac medications before your next appointment, please call your pharmacy*   Lab Work: None If you have labs (blood work) drawn today and your tests are completely normal, you will receive your results only by: Marland Kitchen MyChart Message (if you have MyChart) OR . A paper copy in the mail If you have any lab test that is abnormal or we need to change your treatment, we will call you to review the results.   Testing/Procedures: None   Follow-Up: At [facility], you and your health needs are our priority.  As part of our continuing mission to provide you with exceptional heart care, we have created designated Provider Care Teams.  These Care Teams include your primary Cardiologist (physician) and Advanced Practice Providers (APPs -  Physician Assistants and Nurse Practitioners) who all work together to provide you with the care you need, when you need it.  We recommend signing up for the patient portal called "MyChart".  Sign up information is provided on this After Visit Summary.  MyChart is used to connect with patients for Virtual Visits (Telemedicine).  Patients are able to view lab/test results, encounter notes, upcoming appointments, etc.  Non-urgent messages can be sent to your provider as well.   To learn more about what you can do with MyChart, go to [email or web address].    Your next appointment:   As needed  The format for your next appointment:   In Person  Provider:   Shirlee More, MD   Other Instructions

## 2020-03-24 ENCOUNTER — Telehealth: Payer: Self-pay

## 2020-03-24 NOTE — Telephone Encounter (Signed)
Spoke to patient this morning to follow up on how she was feeling since her 03/18/20 episode of esophageal spasms. She has been taking Carafate prescribed by Dr Bettina Gavia. She still has periodic non cardiac chest pain. At this time patient does not wish to start on any new medications. She drinks aloe juice to sooth her throat and prefers "natural remedy" over medication. Patient will contact the office before her 04/29/20 EGD for any questions or concerns.

## 2020-04-19 ENCOUNTER — Encounter: Payer: Self-pay | Admitting: Gastroenterology

## 2020-04-26 ENCOUNTER — Other Ambulatory Visit: Payer: Self-pay | Admitting: Gastroenterology

## 2020-04-26 DIAGNOSIS — Z1159 Encounter for screening for other viral diseases: Secondary | ICD-10-CM | POA: Diagnosis not present

## 2020-04-27 LAB — SARS CORONAVIRUS 2 (TAT 6-24 HRS): SARS Coronavirus 2: NEGATIVE

## 2020-04-29 ENCOUNTER — Ambulatory Visit (AMBULATORY_SURGERY_CENTER): Payer: PPO | Admitting: Gastroenterology

## 2020-04-29 ENCOUNTER — Encounter: Payer: Self-pay | Admitting: Gastroenterology

## 2020-04-29 ENCOUNTER — Other Ambulatory Visit: Payer: Self-pay

## 2020-04-29 VITALS — BP 100/62 | HR 70 | Temp 97.7°F | Resp 19 | Ht 63.5 in | Wt 164.0 lb

## 2020-04-29 DIAGNOSIS — K3189 Other diseases of stomach and duodenum: Secondary | ICD-10-CM | POA: Diagnosis not present

## 2020-04-29 DIAGNOSIS — K2970 Gastritis, unspecified, without bleeding: Secondary | ICD-10-CM

## 2020-04-29 DIAGNOSIS — R1310 Dysphagia, unspecified: Secondary | ICD-10-CM

## 2020-04-29 DIAGNOSIS — K219 Gastro-esophageal reflux disease without esophagitis: Secondary | ICD-10-CM | POA: Diagnosis not present

## 2020-04-29 DIAGNOSIS — K319 Disease of stomach and duodenum, unspecified: Secondary | ICD-10-CM | POA: Diagnosis not present

## 2020-04-29 MED ORDER — SODIUM CHLORIDE 0.9 % IV SOLN: 500.0000 mL | Freq: Once | INTRAVENOUS | Status: DC

## 2020-04-29 NOTE — Progress Notes (Signed)
1122 Robinul 0.1 mg IV given due large amount of secretions upon assessment.  MD made aware, vss  

## 2020-04-29 NOTE — Progress Notes (Signed)
Called to room to assist during endoscopic procedure.  Patient ID and intended procedure confirmed with present staff. Received instructions for my participation in the procedure from the performing physician.  

## 2020-04-29 NOTE — Progress Notes (Signed)
Report given to PACU, vss 

## 2020-04-29 NOTE — Patient Instructions (Signed)
You must take your nexium as ordered.  Be sure to take it on an empty stomach.  Avoid ibuprofen, aleve, and aspirin for two weeks.   YOU HAD AN ENDOSCOPIC PROCEDURE TODAY AT [facility]:   Refer to the procedure report that was given to you for any specific questions about what was found during the examination.  If the procedure report does not answer your questions, please call your gastroenterologist to clarify.  If you requested that your care partner not be given the details of your procedure findings, then the procedure report has been included in a sealed envelope for you to review at your convenience later.  YOU SHOULD EXPECT: Some feelings of bloating in the abdomen. Passage of more gas than usual.  Walking can help get rid of the air that was put into your GI tract during the procedure and reduce the bloating.   Please Note:  You might notice some irritation and congestion in your nose or some drainage.  This is from the oxygen used during your procedure.  There is no need for concern and it should clear up in a day or so.  SYMPTOMS TO REPORT IMMEDIATELY:    Following upper endoscopy (EGD)  Vomiting of blood or coffee ground material  New chest pain or pain under the shoulder blades  Painful or persistently difficult swallowing  New shortness of breath  Fever of 100F or higher  Black, tarry-looking stools  For urgent or emergent issues, a gastroenterologist can be reached at any hour by calling [phone]. Do not use MyChart messaging for urgent concerns.    DIET:  We do recommend nothing by mouth until 1 pm.  From 1 p m to 2 p m eat  a soft diet.  You may proceed to your regular diet tomorrow..  Drink plenty of fluids but you should avoid alcoholic beverages for 24 hours.  ACTIVITY:  You should plan to take it easy for the rest of today and you should NOT DRIVE or use heavy machinery until tomorrow (because of the sedation medicines used during the test).     FOLLOW UP: Our staff will call the number listed on your records 48-72 hours following your procedure to check on you and address any questions or concerns that you may have regarding the information given to you following your procedure. If we do not reach you, we will leave a message.  We will attempt to reach you two times.  During this call, we will ask if you have developed any symptoms of COVID 19. If you develop any symptoms (ie: fever, flu-like symptoms, shortness of breath, cough etc.) before then, please call [phone].  If you test positive for Covid 19 in the 2 weeks post procedure, please call and report this information to [place].    If any biopsies were taken you will be contacted by phone or by letter within the next 1-3 weeks.  Please call us at [phone] if you have not heard about the biopsies in 3 weeks.    SIGNATURES/CONFIDENTIALITY: You and/or your care partner have signed paperwork which will be entered into your electronic medical record.  These signatures attest to the fact that that the information above on your After Visit Summary has been reviewed and is understood.  Full responsibility of the confidentiality of this discharge information lies with you and/or your care-partner.

## 2020-04-29 NOTE — Progress Notes (Signed)
VS-NS 

## 2020-04-29 NOTE — Op Note (Signed)
[facility] Patient Name: Chelsea Bennett Procedure Date: 04/29/2020 11:22 AM MRN: [id number] Endoscopist: Jackquline Denmark , MD Age: 60 Referring MD:  Date of Birth: 12-10-1960 Gender: Female Account #: [id number] Procedure:                Upper GI endoscopy Indications:              Dysphagia Medicines:                Monitored Anesthesia Care Procedure:                Pre-Anesthesia Assessment:                           - Prior to the procedure, a History and Physical                            was performed, and patient medications and                            allergies were reviewed. The patient's tolerance of                            previous anesthesia was also reviewed. The risks                            and benefits of the procedure and the sedation                            options and risks were discussed with the patient.                            All questions were answered, and informed consent                            was obtained. Prior Anticoagulants: The patient has                            taken no previous anticoagulant or antiplatelet                            agents. ASA Grade Assessment: II - A patient with                            mild systemic disease. After reviewing the risks                            and benefits, the patient was deemed in                            satisfactory condition to undergo the procedure.                           After obtaining informed consent, the endoscope was  passed under direct vision. Throughout the                            procedure, the patient's blood pressure, pulse, and                            oxygen saturations were monitored continuously. The                            Endoscope was introduced through the mouth, and                            advanced to the second part of duodenum. The upper                            GI endoscopy was accomplished without  difficulty.                            The patient tolerated the procedure well. Scope In: Scope Out: Findings:                 The examined esophagus was normal with well-defined                            Z-line at 35 cm. Biopsies were obtained from the                            proximal and distal esophagus with cold forceps for                            histology to r/o eosinophilic esophagitis. The                            scope was withdrawn. Dilation was performed with a                            Maloney dilator with no resistance at 50 Fr. No                            hiatal hernias were noted.                           Localized moderate inflammation characterized by                            erythema was found in the gastric antrum. Biopsies                            were taken with a cold forceps for histology.                           The examined duodenum was normal. Biopsies for  histology were taken with a cold forceps for                            evaluation of celiac disease. Complications:            No immediate complications. Estimated Blood Loss:     Estimated blood loss: none. Impression:               -Moderate gastritis                           -S/P empiric esophageal dilatation. Recommendation:           - Patient has a contact number available for                            emergencies. The signs and symptoms of potential                            delayed complications were discussed with the                            patient. Return to normal activities tomorrow.                            Written discharge instructions were provided to the                            patient.                           - Post dilatation diet.                           - Continue present medications. Must continue                            Nexium 40 mg p.o. once a day.                           - Await pathology results.                            - Avoid ibuprofen, naproxen, or other non-steroidal                            anti-inflammatory drugs, if possible. At least for                            the next 2 weeks.                           - The findings and recommendations were discussed                            with the patient's husband Linna Hoff. Jackquline Denmark, MD 04/29/2020 11:43:24 AM This report has been signed electronically.

## 2020-05-03 ENCOUNTER — Telehealth: Payer: Self-pay | Admitting: *Deleted

## 2020-05-03 NOTE — Telephone Encounter (Signed)
  Follow up Call-  Call back number 04/29/2020 08/08/2017  Post procedure Call Back phone  # [phone]  Permission to leave phone message Yes Yes  Some recent data might be hidden     Patient questions:  Do you have a fever, pain , or abdominal swelling? No. Pain Score  0 *  Have you tolerated food without any problems? Yes.    Have you been able to return to your normal activities? Yes.    Do you have any questions about your discharge instructions: Diet   No. Medications  No. Follow up visit  No.  Do you have questions or concerns about your Care? No.  Actions: * If pain score is 4 or above: No action needed, pain <4  1. Have you developed a fever since your procedure? NO  2.   Have you had an respiratory symptoms (SOB or cough) since your procedure? NO  3.   Have you tested positive for COVID 19 since your procedure NO  4.   Have you had any family members/close contacts diagnosed with the COVID 19 since your procedure?  NO   If yes to any of these questions please route to Joylene John, RN and Joella Prince, RN

## 2020-05-11 ENCOUNTER — Encounter: Payer: Self-pay | Admitting: Gastroenterology

## 2020-06-24 DIAGNOSIS — M7502 Adhesive capsulitis of left shoulder: Secondary | ICD-10-CM | POA: Diagnosis not present

## 2020-07-09 DIAGNOSIS — E785 Hyperlipidemia, unspecified: Secondary | ICD-10-CM | POA: Diagnosis not present

## 2020-07-09 DIAGNOSIS — N3289 Other specified disorders of bladder: Secondary | ICD-10-CM | POA: Diagnosis not present

## 2020-07-09 DIAGNOSIS — N951 Menopausal and female climacteric states: Secondary | ICD-10-CM | POA: Diagnosis not present

## 2020-07-09 DIAGNOSIS — G4700 Insomnia, unspecified: Secondary | ICD-10-CM | POA: Diagnosis not present

## 2020-07-09 DIAGNOSIS — R030 Elevated blood-pressure reading, without diagnosis of hypertension: Secondary | ICD-10-CM | POA: Diagnosis not present

## 2020-07-09 DIAGNOSIS — F419 Anxiety disorder, unspecified: Secondary | ICD-10-CM | POA: Diagnosis not present

## 2020-07-09 DIAGNOSIS — F32A Depression, unspecified: Secondary | ICD-10-CM | POA: Diagnosis not present

## 2020-07-09 DIAGNOSIS — Z6829 Body mass index (BMI) 29.0-29.9, adult: Secondary | ICD-10-CM | POA: Diagnosis not present

## 2020-07-09 DIAGNOSIS — Z79899 Other long term (current) drug therapy: Secondary | ICD-10-CM | POA: Diagnosis not present

## 2020-07-09 DIAGNOSIS — M791 Myalgia, unspecified site: Secondary | ICD-10-CM | POA: Diagnosis not present

## 2020-07-09 DIAGNOSIS — E559 Vitamin D deficiency, unspecified: Secondary | ICD-10-CM | POA: Diagnosis not present

## 2020-07-09 DIAGNOSIS — E538 Deficiency of other specified B group vitamins: Secondary | ICD-10-CM | POA: Diagnosis not present

## 2020-07-28 DIAGNOSIS — R030 Elevated blood-pressure reading, without diagnosis of hypertension: Secondary | ICD-10-CM | POA: Diagnosis not present

## 2020-07-28 DIAGNOSIS — F419 Anxiety disorder, unspecified: Secondary | ICD-10-CM | POA: Diagnosis not present

## 2020-07-28 DIAGNOSIS — G4700 Insomnia, unspecified: Secondary | ICD-10-CM | POA: Diagnosis not present

## 2020-07-28 DIAGNOSIS — Z6829 Body mass index (BMI) 29.0-29.9, adult: Secondary | ICD-10-CM | POA: Diagnosis not present

## 2020-08-25 DIAGNOSIS — F419 Anxiety disorder, unspecified: Secondary | ICD-10-CM | POA: Diagnosis not present

## 2020-08-25 DIAGNOSIS — Z6830 Body mass index (BMI) 30.0-30.9, adult: Secondary | ICD-10-CM | POA: Diagnosis not present

## 2020-08-25 DIAGNOSIS — N3289 Other specified disorders of bladder: Secondary | ICD-10-CM | POA: Diagnosis not present

## 2020-08-25 DIAGNOSIS — F32A Depression, unspecified: Secondary | ICD-10-CM | POA: Diagnosis not present

## 2020-08-25 DIAGNOSIS — R030 Elevated blood-pressure reading, without diagnosis of hypertension: Secondary | ICD-10-CM | POA: Diagnosis not present

## 2020-08-25 DIAGNOSIS — G4700 Insomnia, unspecified: Secondary | ICD-10-CM | POA: Diagnosis not present

## 2020-10-20 DIAGNOSIS — Z79890 Hormone replacement therapy (postmenopausal): Secondary | ICD-10-CM | POA: Diagnosis not present

## 2020-11-02 DIAGNOSIS — S8011XA Contusion of right lower leg, initial encounter: Secondary | ICD-10-CM | POA: Diagnosis not present

## 2020-11-02 DIAGNOSIS — S8991XA Unspecified injury of right lower leg, initial encounter: Secondary | ICD-10-CM | POA: Diagnosis not present

## 2020-11-02 DIAGNOSIS — M79604 Pain in right leg: Secondary | ICD-10-CM | POA: Diagnosis not present

## 2020-12-17 DIAGNOSIS — B379 Candidiasis, unspecified: Secondary | ICD-10-CM | POA: Diagnosis not present

## 2020-12-17 DIAGNOSIS — R350 Frequency of micturition: Secondary | ICD-10-CM | POA: Diagnosis not present

## 2020-12-17 DIAGNOSIS — T3695XA Adverse effect of unspecified systemic antibiotic, initial encounter: Secondary | ICD-10-CM | POA: Diagnosis not present

## 2020-12-17 DIAGNOSIS — R102 Pelvic and perineal pain: Secondary | ICD-10-CM | POA: Diagnosis not present

## 2020-12-23 DIAGNOSIS — F418 Other specified anxiety disorders: Secondary | ICD-10-CM | POA: Diagnosis not present

## 2020-12-23 DIAGNOSIS — Z1231 Encounter for screening mammogram for malignant neoplasm of breast: Secondary | ICD-10-CM | POA: Diagnosis not present

## 2020-12-23 DIAGNOSIS — E669 Obesity, unspecified: Secondary | ICD-10-CM | POA: Diagnosis not present

## 2020-12-23 DIAGNOSIS — R300 Dysuria: Secondary | ICD-10-CM | POA: Diagnosis not present

## 2020-12-23 DIAGNOSIS — Z6830 Body mass index (BMI) 30.0-30.9, adult: Secondary | ICD-10-CM | POA: Diagnosis not present

## 2020-12-23 DIAGNOSIS — Z79899 Other long term (current) drug therapy: Secondary | ICD-10-CM | POA: Diagnosis not present

## 2020-12-23 DIAGNOSIS — N898 Other specified noninflammatory disorders of vagina: Secondary | ICD-10-CM | POA: Diagnosis not present

## 2021-01-07 DIAGNOSIS — N952 Postmenopausal atrophic vaginitis: Secondary | ICD-10-CM | POA: Diagnosis not present

## 2021-01-07 DIAGNOSIS — N760 Acute vaginitis: Secondary | ICD-10-CM | POA: Diagnosis not present

## 2021-02-01 DIAGNOSIS — L820 Inflamed seborrheic keratosis: Secondary | ICD-10-CM | POA: Diagnosis not present

## 2021-02-01 DIAGNOSIS — D485 Neoplasm of uncertain behavior of skin: Secondary | ICD-10-CM | POA: Diagnosis not present

## 2021-02-01 DIAGNOSIS — D224 Melanocytic nevi of scalp and neck: Secondary | ICD-10-CM | POA: Diagnosis not present

## 2021-02-03 DIAGNOSIS — Z1231 Encounter for screening mammogram for malignant neoplasm of breast: Secondary | ICD-10-CM | POA: Diagnosis not present

## 2021-02-04 IMAGING — CT CT HEART MORP W/ CTA COR W/ SCORE W/ CA W/CM &/OR W/O CM
4 of 7 series · 8 of 20 positions shown, 9 images · IV contrast (APPLIED)
Comparison: None.
COMPARISON: None.

Addendum:
EXAM:
OVER-READ INTERPRETATION  CT CHEST

The following report is an over-read performed by radiologist Dr.
Khwasi Harrison [REDACTED] on 03/08/2020. This
over-read does not include interpretation of cardiac or coronary
anatomy or pathology. The coronary calcium score/coronary CTA
interpretation by the cardiologist is attached.
CLINICAL DATA: 59 yo female with chest pain
Cardiac/Coronary  CT
TECHNIQUE: The patient was scanned on a Phillips Force scanner.

[Series 6: best diast · axial · 0.39mm/px · z∈[-360,-319]mm · 2 of 311 slices shown, 3 images]
[im 104/311  vessel]
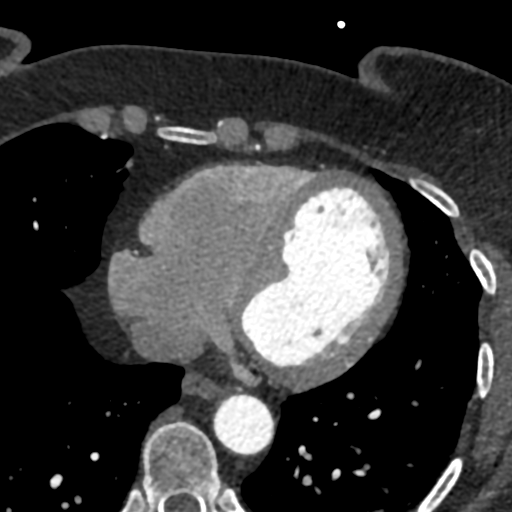
[im 104/311  lung]
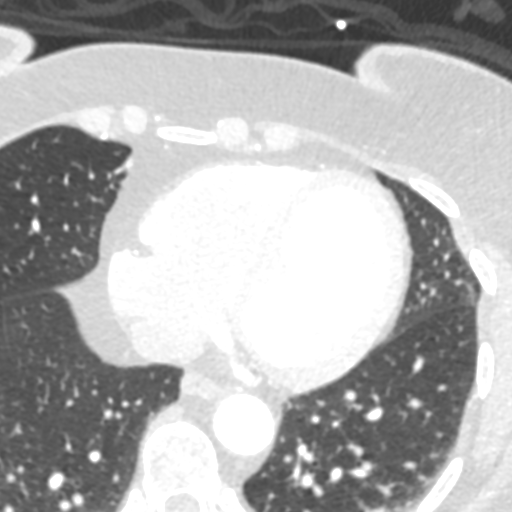
[im 207/311  vessel]
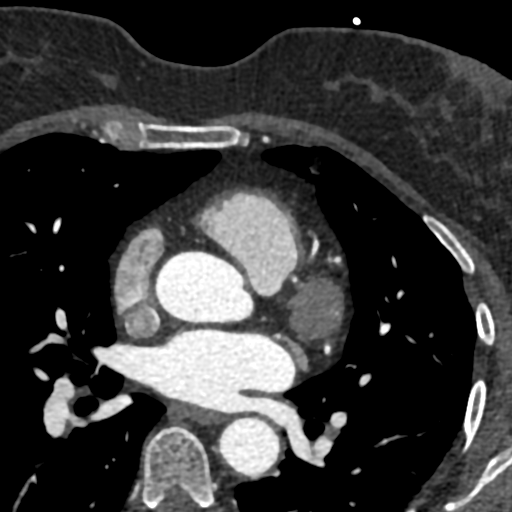

[Series 7: best syst · axial · 0.39mm/px · z∈[-360,-319]mm · 2 of 311 slices shown]
[im 104/311  vessel]
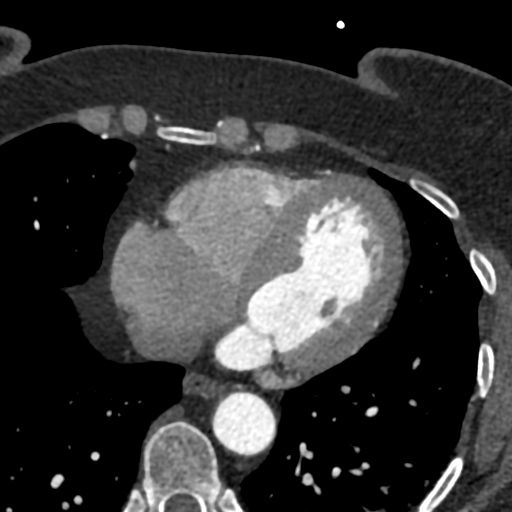
[im 207/311  vessel]
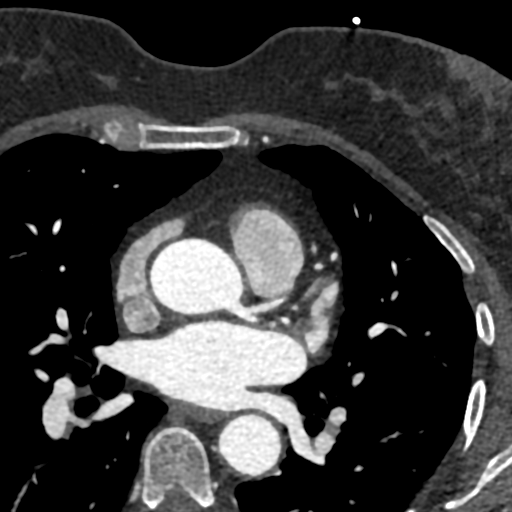

[Series 8: ts diast sharp · axial · 0.39mm/px · z∈[-360,-319]mm · 2 of 311 slices shown]
[im 104/311  lung]
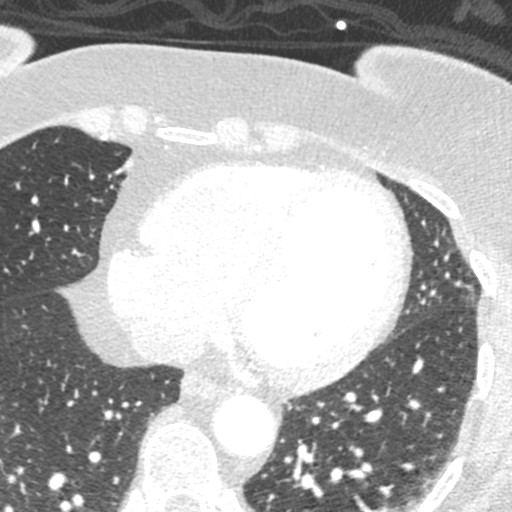
[im 207/311  lung]
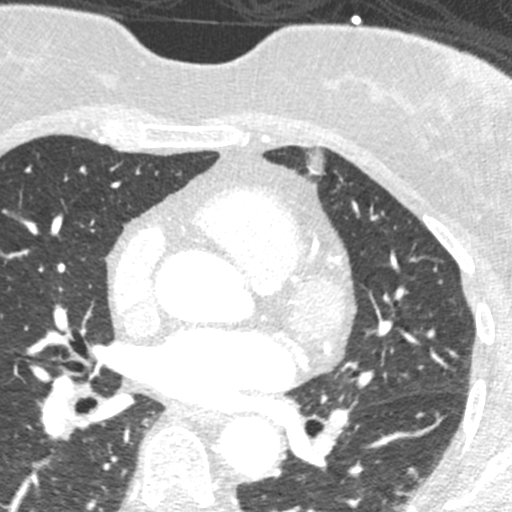

[Series 9: ts syst sharp · axial · 0.39mm/px · z∈[-360,-319]mm · 2 of 311 slices shown]
[im 104/311  lung]
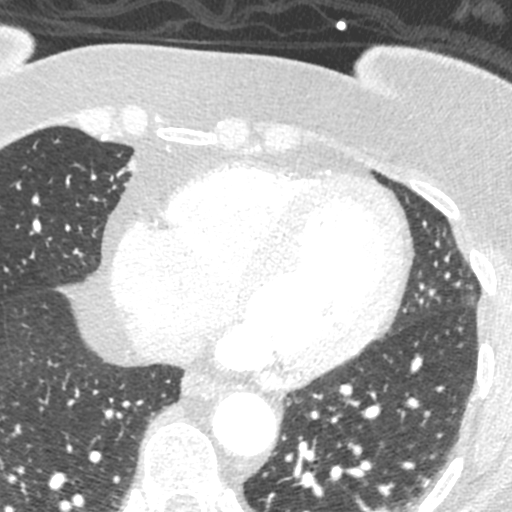
[im 207/311  lung]
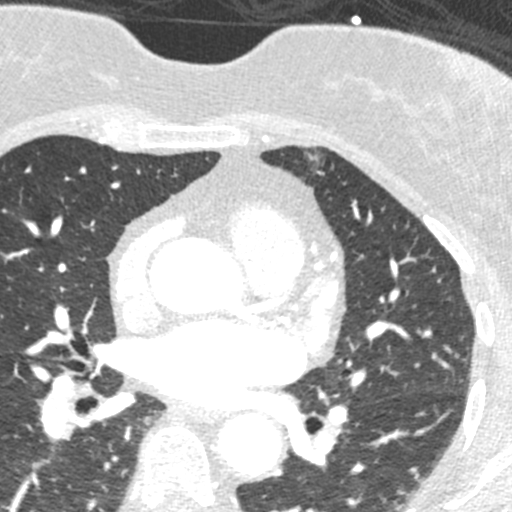

[8 of 20 positions shown; findings below may reference images not displayed]

FINDINGS: Within the visualized portions of the thorax there are no suspicious
appearing pulmonary nodules or masses, there is no acute
consolidative airspace disease, no pleural effusions, no
pneumothorax and no lymphadenopathy. Visualized portions of the
upper abdomen are unremarkable. There are no aggressive appearing
lytic or blastic lesions noted in the visualized portions of the
skeleton.
IMPRESSION: No significant incidental noncardiac findings are noted.
FINDINGS: A 120 kV prospective scan was triggered in the descending thoracic
aorta at 111 HU's. Axial non-contrast 3 mm slices were carried out
through the heart. The data set was analyzed on a dedicated work
station and scored using the Agatson method. Gantry rotation speed
was 250 msecs and collimation was .6 mm. No beta blockade and 0.8 mg
of sl NTG was given. The 3D data set was reconstructed in 5%
intervals of the 67-82 % of the R-R cycle. Diastolic phases were
analyzed on a dedicated work station using MPR, MIP and VRT modes.
The patient received 80 cc of contrast.

Aorta:  Normal size.  Minimal atherosclerosis noted.  No dissection.

Aortic Valve:  Trileaflet.  No calcifications.

Coronary Arteries:  Normal coronary origin.  Right dominance.

RCA is a small dominant artery that gives rise to PDA and PLVB.
There is no plaque.

Left main is a large artery that gives rise to LAD, small ramus
intermedius and LCX arteries.

LAD is a large vessel that gives rise to large branching diagonal;
there is no plaque.

LCX is a non-dominant artery that gives rise to one large branching
OM. There is no plaque.

Other findings:

Normal pulmonary vein drainage into the left atrium.

Normal let atrial appendage without a thrombus.

Normal size of the pulmonary artery.
IMPRESSION: 1. Coronary calcium score of 0. This was 0 percentile for age and
sex matched control.

2. Normal coronary origin with right dominance.

3. No evidence of CAD; CAD-RADS 0.

Edson Mauro Supanta

*** End of Addendum ***
EXAM:
OVER-READ INTERPRETATION  CT CHEST

The following report is an over-read performed by radiologist Dr.
Khwasi Harrison [REDACTED] on 03/08/2020. This
over-read does not include interpretation of cardiac or coronary
anatomy or pathology. The coronary calcium score/coronary CTA
interpretation by the cardiologist is attached.
FINDINGS: Within the visualized portions of the thorax there are no suspicious
appearing pulmonary nodules or masses, there is no acute
consolidative airspace disease, no pleural effusions, no
pneumothorax and no lymphadenopathy. Visualized portions of the
upper abdomen are unremarkable. There are no aggressive appearing
lytic or blastic lesions noted in the visualized portions of the
skeleton.
IMPRESSION: No significant incidental noncardiac findings are noted.

## 2021-03-11 DIAGNOSIS — Z79899 Other long term (current) drug therapy: Secondary | ICD-10-CM | POA: Diagnosis not present

## 2021-03-11 DIAGNOSIS — F418 Other specified anxiety disorders: Secondary | ICD-10-CM | POA: Diagnosis not present

## 2021-03-11 DIAGNOSIS — Z6830 Body mass index (BMI) 30.0-30.9, adult: Secondary | ICD-10-CM | POA: Diagnosis not present

## 2021-03-11 DIAGNOSIS — K219 Gastro-esophageal reflux disease without esophagitis: Secondary | ICD-10-CM | POA: Diagnosis not present

## 2021-03-11 DIAGNOSIS — E538 Deficiency of other specified B group vitamins: Secondary | ICD-10-CM | POA: Diagnosis not present

## 2021-03-11 DIAGNOSIS — G4700 Insomnia, unspecified: Secondary | ICD-10-CM | POA: Diagnosis not present

## 2021-03-11 DIAGNOSIS — N3281 Overactive bladder: Secondary | ICD-10-CM | POA: Diagnosis not present

## 2021-03-11 DIAGNOSIS — E559 Vitamin D deficiency, unspecified: Secondary | ICD-10-CM | POA: Diagnosis not present

## 2021-03-11 DIAGNOSIS — E782 Mixed hyperlipidemia: Secondary | ICD-10-CM | POA: Diagnosis not present

## 2022-04-06 ENCOUNTER — Ambulatory Visit: Payer: PPO | Admitting: Gastroenterology

## 2022-04-13 DIAGNOSIS — Z1231 Encounter for screening mammogram for malignant neoplasm of breast: Secondary | ICD-10-CM | POA: Diagnosis not present

## 2022-04-27 DIAGNOSIS — Z0100 Encounter for examination of eyes and vision without abnormal findings: Secondary | ICD-10-CM | POA: Diagnosis not present

## 2022-04-27 DIAGNOSIS — H5213 Myopia, bilateral: Secondary | ICD-10-CM | POA: Diagnosis not present

## 2022-05-09 ENCOUNTER — Ambulatory Visit: Payer: Medicare HMO | Admitting: Gastroenterology

## 2022-05-09 VITALS — BP 128/88 | HR 86 | Ht 63.0 in | Wt 166.1 lb

## 2022-05-09 DIAGNOSIS — K219 Gastro-esophageal reflux disease without esophagitis: Secondary | ICD-10-CM | POA: Diagnosis not present

## 2022-05-09 DIAGNOSIS — R1310 Dysphagia, unspecified: Secondary | ICD-10-CM | POA: Diagnosis not present

## 2022-05-09 DIAGNOSIS — Z1211 Encounter for screening for malignant neoplasm of colon: Secondary | ICD-10-CM

## 2022-05-09 DIAGNOSIS — Z1212 Encounter for screening for malignant neoplasm of rectum: Secondary | ICD-10-CM

## 2022-05-09 MED ORDER — ESOMEPRAZOLE MAGNESIUM 40 MG PO CPDR: 40.0000 mg | DELAYED_RELEASE_CAPSULE | Freq: Every day | ORAL | 4 refills | Status: AC

## 2022-05-09 MED ORDER — HYOSCYAMINE SULFATE 0.125 MG SL SUBL: 0.1250 mg | SUBLINGUAL_TABLET | Freq: Three times a day (TID) | SUBLINGUAL | 1 refills | Status: DC

## 2022-05-09 NOTE — Patient Instructions (Addendum)
_______________________________________________________  If your blood pressure at your visit was 140/90 or greater, please contact your primary care physician to follow up on this.  _______________________________________________________  If you are age 61 or older, your body mass index should be between 23-30. Your Body mass index is 29.43 kg/m. If this is out of the aforementioned range listed, please consider follow up with your Primary Care Provider.  If you are age 94 or younger, your body mass index should be between 19-25. Your Body mass index is 29.43 kg/m. If this is out of the aformentioned range listed, please consider follow up with your Primary Care Provider.   ________________________________________________________  The [facility] providers would like to encourage you to use [facility] to communicate with providers for non-urgent requests or questions.  Due to long hold times on the telephone, sending your provider a message by [facility] may be a faster and more efficient way to get a response.  Please allow 48 business hours for a response.  Please remember that this is for non-urgent requests.  _______________________________________________________  Dennis Bast have been scheduled for an endoscopy and colonoscopy. Please follow the written instructions given to you at your visit today. Please pick up your prep supplies at the pharmacy within the next 1-3 days. If you use inhalers (even only as needed), please bring them with you on the day of your procedure.  We have sent the following medications to your pharmacy for you to pick up at your convenience: Nexium Levsin  Please call with any questions or concerns.  Thank you,  Dr. Jackquline Denmark

## 2022-05-09 NOTE — Progress Notes (Addendum)
Chief Complaint: FU  Referring Provider:  Dr Micheal Likens      ASSESSMENT AND PLAN;   #1.  LLQ pain (resolved). Neg CT AP 01/2022 except for diverticulosis without diverticulitis.  #2. GERD with small hiatal hernia, Now with dysphagia  #3. IBS. Due for screening colon. Neg colonoscopy 12/2011   Plan: -EGD with dil/Colon  -Increased Nexium '40mg'$  po QD #90, 4RF -Hyoscamine 0.'125mg'$  S/L Q8hrs prn #60.     I discussed EGD/Colonoscopy- the indications, risks, alternatives and potential complications including, but not limited to bleeding, infection, reaction to meds, damage to internal organs, cardiac and/or pulmonary problems, and perforation requiring surgery. The possibility that significant findings could be missed was explained. All ? were answered. Pt consents to proceed.  HPI:    62yrold Accompanied by her husband   LLQ pain x 3 weeks.  More or less constant.  No fever or chills. Neg CT AP with contrast 01/03/2022 for diverticulitis. Treated conservatively.  Feels better.  Does have history of intermittent pellet-like stools with mild constipation.  Has alternating diarrhea..  Mild associated abdominal bloating.  No melena or hematochezia.  Has been controlling her IBS with natural ingredients including Nutmeg/ginger/honey.   With dysphagia for capsules Getting worse over the last few months. Has been having regurgitation and occ hearburn Has been taking Nexium 20 mg p.o. daily. No sodas, chocolates, chewing gums, artificial sweeteners and candy. No NSAIDs No weight loss.  Would like to get EGD with dilatation performed at the time of screening colonoscopy.   Past GI history:  EGD 04/29/2020 with dil -Mod gastritis -S/P dilatation 50Fr -Neg Bx for EoE, HP  EGD 08/08/2017 -Small HH -S/P dil 50 Fr -2013: neg small bowel biopsies for celiac  Colonoscopy 12/2011 ([facility]) -Mild sigmoid diverticulosis -Otherwise normal colonoscopy to TI -Repeat in 10 years.  CT AP with  contrast 01/03/2022.  Report sent for scanning. -Neg -Diverticulosis without diverticulitis. -S/P hysterectomy, appendicectomy, cholecystectomy.   Past Medical History:  Diagnosis Date   Abnormal stress test 04/06/2016   Formatting of this note might be different from the original. Added automatically from request for surgery [id number] of this note might be different from the original. Overview:  Added automatically from request for surgery [id number]  Allergy    Anemia    with pregnancy   Anxiety    Arthritis    Cataract    removed bilateraly   Depression    Dysphagia    Exertional chest pain 04/06/2016   Formatting of this note might be different from the original. Added automatically from request for surgery [id number] of this note might be different from the original. Overview:  Added automatically from request for surgery [id number]  Fibromyalgia    GERD (gastroesophageal reflux disease)    Vitamin B12 deficiency    Past Surgical History:  Procedure Laterality Date   ABDOMINAL HYSTERECTOMY     APPENDECTOMY     CATARACT EXTRACTION     CESAREAN SECTION     CHOLECYSTECTOMY     COLONOSCOPY  12/13/2011   Mild sigmoid diverticulosis. Small internal hemorrhoids. Otherwise noraml TI.    ESOPHAGOGASTRODUODENOSCOPY  12/13/2011   Minimal hiatal hernia. Mild gastrtiis.    HEMORRHOID SURGERY     SHOULDER SURGERY      Family History  Problem Relation Age of Onset   Pancreatic cancer Mother    Prostate cancer Father    Colon cancer Neg Hx    Stomach cancer Neg Hx  Rectal cancer Neg Hx    Esophageal cancer Neg Hx     Social History   Tobacco Use   Smoking status: Never   Smokeless tobacco: Never  Vaping Use   Vaping Use: Never used  Substance Use Topics   Alcohol use: Never   Drug use: Never    Current Outpatient Medications  Medication Sig Dispense Refill   CLONAZEPAM PO Take by mouth as needed.     Cyanocobalamin (VITAMIN B-12 PO) Take by mouth daily  in the afternoon.     oxybutynin (DITROPAN) 5 MG tablet TAKE ONE TABLET BY MOUTH TWICE DAILY AS NEEDED for bladder spasm  5   Calcium Carb-Cholecalciferol (CALCIUM 1000 + D) 1000-800 MG-UNIT TABS Take by mouth.     cetirizine (ZYRTEC) 10 MG tablet Take 10 mg by mouth daily as needed.     Cholecalciferol (VITAMIN D3) 2000 units TABS Take by mouth.     esomeprazole (NEXIUM) 20 MG packet Take 20 mg by mouth daily before breakfast.     Eszopiclone 3 MG TABS Take 3 mg by mouth at bedtime.  5   glucosamine-chondroitin 500-400 MG tablet Take 1 tablet by mouth 3 (three) times daily.     ibuprofen (ADVIL) 200 MG tablet Take 200 mg by mouth every 6 (six) hours as needed.     methocarbamol (ROBAXIN) 750 MG tablet TAKE ONE TABLET BY MOUTH THREE TIMES DAILY     NON FORMULARY 1,250 mg daily. Magnesium malate     NON FORMULARY Progesterone SR 100 mg      Take 2 capsules nightly     NON FORMULARY Biest / Dhea                  1 gel daily     sucralfate (CARAFATE) 1 GM/10ML suspension Take 10 mLs (1 g total) by mouth at bedtime. (Patient not taking: Reported on 04/29/2020) 300 mL 3   traMADol (ULTRAM) 50 MG tablet Take 50 mg by mouth every 6 (six) hours as needed.  (Patient not taking: No sig reported)     vitamin E 1000 UNIT capsule Take by mouth.     zinc gluconate 50 MG tablet Take 50 mg by mouth daily.     No current facility-administered medications for this visit.    Allergies  Allergen Reactions   Aminophylline Rash   Amitriptyline Rash   Citalopram Rash   Clarithromycin Rash   Fluoxetine Rash   Latex Dermatitis   Olanzapine Rash   Oxycodone Itching   Paroxetine Hcl Rash   Tioconazole Rash   Triamterene Rash   Triazolam Rash    Review of Systems:  neg     Physical Exam:    BP 128/88   Pulse 86   Ht '5\' 3"'$  (1.6 m)   Wt 166 lb 2 oz (75.4 kg)   BMI 29.43 kg/m  Filed Weights   05/09/22 1110  Weight: 166 lb 2 oz (75.4 kg)   Constitutional:  Well-developed, in no acute  distress. Psychiatric: Normal mood and affect. Behavior is normal. HEENT: Pupils normal.  Conjunctivae are normal. No scleral icterus.  Cardiovascular: Normal rate, regular rhythm. No edema Pulmonary/chest: Effort normal and breath sounds normal. No wheezing, rales or rhonchi. Abdominal: Soft, nondistended. Nontender. Bowel sounds active throughout. There are no masses palpable. No hepatomegaly. Rectal:  defered Neurological: Alert and oriented to person place and time. Skin: Skin is warm and dry. No rashes noted.    Carmell Austria, MD  Cc: Dr Micheal Likens

## 2022-05-09 NOTE — Addendum Note (Signed)
Addended by: Curlene Labrum E on: 05/09/2022 12:46 PM   Modules accepted: Orders

## 2022-06-13 DIAGNOSIS — M5459 Other low back pain: Secondary | ICD-10-CM | POA: Diagnosis not present

## 2022-06-13 DIAGNOSIS — M542 Cervicalgia: Secondary | ICD-10-CM | POA: Diagnosis not present

## 2022-06-13 DIAGNOSIS — M9903 Segmental and somatic dysfunction of lumbar region: Secondary | ICD-10-CM | POA: Diagnosis not present

## 2022-06-13 DIAGNOSIS — M9901 Segmental and somatic dysfunction of cervical region: Secondary | ICD-10-CM | POA: Diagnosis not present

## 2022-06-14 DIAGNOSIS — M546 Pain in thoracic spine: Secondary | ICD-10-CM | POA: Diagnosis not present

## 2022-06-14 DIAGNOSIS — M9902 Segmental and somatic dysfunction of thoracic region: Secondary | ICD-10-CM | POA: Diagnosis not present

## 2022-06-14 DIAGNOSIS — M5388 Other specified dorsopathies, sacral and sacrococcygeal region: Secondary | ICD-10-CM | POA: Diagnosis not present

## 2022-06-14 DIAGNOSIS — M47813 Spondylosis without myelopathy or radiculopathy, cervicothoracic region: Secondary | ICD-10-CM | POA: Diagnosis not present

## 2022-06-14 DIAGNOSIS — M9901 Segmental and somatic dysfunction of cervical region: Secondary | ICD-10-CM | POA: Diagnosis not present

## 2022-06-14 DIAGNOSIS — M9904 Segmental and somatic dysfunction of sacral region: Secondary | ICD-10-CM | POA: Diagnosis not present

## 2022-06-16 DIAGNOSIS — M5388 Other specified dorsopathies, sacral and sacrococcygeal region: Secondary | ICD-10-CM | POA: Diagnosis not present

## 2022-06-16 DIAGNOSIS — M546 Pain in thoracic spine: Secondary | ICD-10-CM | POA: Diagnosis not present

## 2022-06-16 DIAGNOSIS — M47813 Spondylosis without myelopathy or radiculopathy, cervicothoracic region: Secondary | ICD-10-CM | POA: Diagnosis not present

## 2022-06-16 DIAGNOSIS — M9904 Segmental and somatic dysfunction of sacral region: Secondary | ICD-10-CM | POA: Diagnosis not present

## 2022-06-16 DIAGNOSIS — M9902 Segmental and somatic dysfunction of thoracic region: Secondary | ICD-10-CM | POA: Diagnosis not present

## 2022-06-16 DIAGNOSIS — M9901 Segmental and somatic dysfunction of cervical region: Secondary | ICD-10-CM | POA: Diagnosis not present

## 2022-06-27 DIAGNOSIS — M9901 Segmental and somatic dysfunction of cervical region: Secondary | ICD-10-CM | POA: Diagnosis not present

## 2022-06-27 DIAGNOSIS — M5388 Other specified dorsopathies, sacral and sacrococcygeal region: Secondary | ICD-10-CM | POA: Diagnosis not present

## 2022-06-27 DIAGNOSIS — M9902 Segmental and somatic dysfunction of thoracic region: Secondary | ICD-10-CM | POA: Diagnosis not present

## 2022-06-27 DIAGNOSIS — M47813 Spondylosis without myelopathy or radiculopathy, cervicothoracic region: Secondary | ICD-10-CM | POA: Diagnosis not present

## 2022-06-27 DIAGNOSIS — M9904 Segmental and somatic dysfunction of sacral region: Secondary | ICD-10-CM | POA: Diagnosis not present

## 2022-06-27 DIAGNOSIS — M546 Pain in thoracic spine: Secondary | ICD-10-CM | POA: Diagnosis not present

## 2022-07-05 ENCOUNTER — Ambulatory Visit: Payer: Medicare HMO | Admitting: Gastroenterology

## 2022-07-05 ENCOUNTER — Encounter: Payer: Self-pay | Admitting: Gastroenterology

## 2022-07-05 VITALS — BP 124/59 | HR 61 | Temp 97.5°F | Resp 15 | Ht 63.0 in | Wt 166.0 lb

## 2022-07-05 DIAGNOSIS — F32A Depression, unspecified: Secondary | ICD-10-CM | POA: Diagnosis not present

## 2022-07-05 DIAGNOSIS — K3189 Other diseases of stomach and duodenum: Secondary | ICD-10-CM | POA: Diagnosis not present

## 2022-07-05 DIAGNOSIS — Z1211 Encounter for screening for malignant neoplasm of colon: Secondary | ICD-10-CM

## 2022-07-05 DIAGNOSIS — E785 Hyperlipidemia, unspecified: Secondary | ICD-10-CM | POA: Diagnosis not present

## 2022-07-05 DIAGNOSIS — K2289 Other specified disease of esophagus: Secondary | ICD-10-CM | POA: Diagnosis not present

## 2022-07-05 DIAGNOSIS — M797 Fibromyalgia: Secondary | ICD-10-CM | POA: Diagnosis not present

## 2022-07-05 DIAGNOSIS — K219 Gastro-esophageal reflux disease without esophagitis: Secondary | ICD-10-CM | POA: Diagnosis not present

## 2022-07-05 DIAGNOSIS — R1310 Dysphagia, unspecified: Secondary | ICD-10-CM

## 2022-07-05 DIAGNOSIS — F419 Anxiety disorder, unspecified: Secondary | ICD-10-CM | POA: Diagnosis not present

## 2022-07-05 MED ORDER — SODIUM CHLORIDE 0.9 % IV SOLN: 500.0000 mL | INTRAVENOUS | Status: AC

## 2022-07-05 NOTE — Progress Notes (Signed)
Chief Complaint: FU  Referring Provider:  Dr Micheal Likens      ASSESSMENT AND PLAN;   #1.  LLQ pain (resolved). Neg CT AP 01/2022 except for diverticulosis without diverticulitis.  #2. GERD with small hiatal hernia, Now with dysphagia  #3. IBS. Due for screening colon. Neg colonoscopy 12/2011   Plan: -EGD with dil/Colon  -Increased Nexium 40mg  po QD #90, 4RF -Hyoscamine 0.125mg  S/L Q8hrs prn #60.     I discussed EGD/Colonoscopy- the indications, risks, alternatives and potential complications including, but not limited to bleeding, infection, reaction to meds, damage to internal organs, cardiac and/or pulmonary problems, and perforation requiring surgery. The possibility that significant findings could be missed was explained. All ? were answered. Pt consents to proceed.  HPI:    62yr old Accompanied by her husband   LLQ pain x 3 weeks.  More or less constant.  No fever or chills. Neg CT AP with contrast 01/03/2022 for diverticulitis. Treated conservatively.  Feels better.  Does have history of intermittent pellet-like stools with mild constipation.  Has alternating diarrhea..  Mild associated abdominal bloating.  No melena or hematochezia.  Has been controlling her IBS with natural ingredients including Nutmeg/ginger/honey.   With dysphagia for capsules Getting worse over the last few months. Has been having regurgitation and occ hearburn Has been taking Nexium 20 mg p.o. daily. No sodas, chocolates, chewing gums, artificial sweeteners and candy. No NSAIDs No weight loss.  Would like to get EGD with dilatation performed at the time of screening colonoscopy.   Past GI history:  EGD 04/29/2020 with dil -Mod gastritis -S/P dilatation 50Fr -Neg Bx for EoE, HP  EGD 08/08/2017 -Small HH -S/P dil 50 Fr -2013: neg small bowel biopsies for celiac  Colonoscopy 12/2011 ([facility]) -Mild sigmoid diverticulosis -Otherwise normal colonoscopy to TI -Repeat in 10 years.  CT AP with  contrast 01/03/2022.  Report sent for scanning. -Neg -Diverticulosis without diverticulitis. -S/P hysterectomy, appendicectomy, cholecystectomy.   Past Medical History:  Diagnosis Date   Abnormal stress test 04/06/2016   Formatting of this note might be different from the original. Added automatically from request for surgery [id number] Formatting of this note might be different from the original. Overview:  Added automatically from request for surgery [id number]   Allergy    Anemia    with pregnancy   Anxiety    Arthritis    Cataract    removed bilateraly   Depression    Dysphagia    Exertional chest pain 04/06/2016   Formatting of this note might be different from the original. Added automatically from request for surgery [id number] Formatting of this note might be different from the original. Overview:  Added automatically from request for surgery [id number]   Fibromyalgia    GERD (gastroesophageal reflux disease)    Hyperlipidemia    Vitamin B12 deficiency    Past Surgical History:  Procedure Laterality Date   ABDOMINAL HYSTERECTOMY     APPENDECTOMY     CATARACT EXTRACTION     CESAREAN SECTION     CHOLECYSTECTOMY     COLONOSCOPY  12/13/2011   Mild sigmoid diverticulosis. Small internal hemorrhoids. Otherwise noraml TI.    ESOPHAGOGASTRODUODENOSCOPY  12/13/2011   Minimal hiatal hernia. Mild gastrtiis.    HEMORRHOID SURGERY     SHOULDER SURGERY      Family History  Problem Relation Age of Onset   Pancreatic cancer Mother    Prostate cancer Father    Colon cancer Neg Hx    Stomach  cancer Neg Hx    Rectal cancer Neg Hx    Esophageal cancer Neg Hx     Social History   Tobacco Use   Smoking status: Never   Smokeless tobacco: Never  Vaping Use   Vaping Use: Never used  Substance Use Topics   Alcohol use: Never   Drug use: Never    Current Outpatient Medications  Medication Sig Dispense Refill   Calcium Carb-Cholecalciferol (CALCIUM 1000 + D) 1000-800 MG-UNIT TABS Take  by mouth.     Cholecalciferol (VITAMIN D3) 2000 units TABS Take by mouth.     CLONAZEPAM PO Take by mouth as needed.     Cyanocobalamin (VITAMIN B-12 PO) Take by mouth daily in the afternoon.     esomeprazole (NEXIUM) 40 MG capsule Take 1 capsule (40 mg total) by mouth daily. 90 capsule 4   Eszopiclone 3 MG TABS Take 3 mg by mouth at bedtime.  5   Eszopiclone 3 MG TABS Take 3 mg by mouth at bedtime.     methocarbamol (ROBAXIN) 750 MG tablet TAKE ONE TABLET BY MOUTH THREE TIMES DAILY     NON FORMULARY 1,250 mg daily. Magnesium malate     NON FORMULARY Progesterone SR 100 mg      Take 2 capsules nightly     NON FORMULARY Biest / Dhea                  1 gel daily     oxybutynin (DITROPAN) 5 MG tablet Take 2.5 mg by mouth 2 (two) times daily.     vitamin E 1000 UNIT capsule Take by mouth.     Vitamin E 670 MG (1000 UT) CAPS Take by mouth.     cetirizine (ZYRTEC) 10 MG tablet Take 10 mg by mouth daily as needed.     glucosamine-chondroitin 500-400 MG tablet Take 1 tablet by mouth 3 (three) times daily.     ibuprofen (ADVIL) 200 MG tablet Take 200 mg by mouth every 6 (six) hours as needed.     sucralfate (CARAFATE) 1 GM/10ML suspension Take 10 mLs (1 g total) by mouth at bedtime. 300 mL 3   traMADol (ULTRAM) 50 MG tablet Take 50 mg by mouth every 6 (six) hours as needed.     Current Facility-Administered Medications  Medication Dose Route Frequency Provider Last Rate Last Admin   0.9 %  sodium chloride infusion  500 mL Intravenous Continuous Jackquline Denmark, MD        Allergies  Allergen Reactions   Aminophylline Rash   Amitriptyline Rash   Citalopram Rash   Clarithromycin Rash   Fluoxetine Rash   Latex Dermatitis   Olanzapine Rash   Oxycodone Itching   Paroxetine Hcl Rash   Tioconazole Rash   Triamterene Rash   Triazolam Rash    Review of Systems:  neg     Physical Exam:    BP 124/68   Pulse 70   Temp (!) 97.5 F (36.4 C) (Skin)   Ht 5\' 3"  (1.6 m)   Wt 166 lb (75.3 kg)    SpO2 97%   BMI 29.41 kg/m  Filed Weights   07/05/22 0825  Weight: 166 lb (75.3 kg)   Constitutional:  Well-developed, in no acute distress. Psychiatric: Normal mood and affect. Behavior is normal. HEENT: Pupils normal.  Conjunctivae are normal. No scleral icterus.  Cardiovascular: Normal rate, regular rhythm. No edema Pulmonary/chest: Effort normal and breath sounds normal. No wheezing, rales or rhonchi. Abdominal: Soft, nondistended.  Nontender. Bowel sounds active throughout. There are no masses palpable. No hepatomegaly. Rectal:  defered Neurological: Alert and oriented to person place and time. Skin: Skin is warm and dry. No rashes noted.    Carmell Austria, MD   Cc: Dr Micheal Likens

## 2022-07-05 NOTE — Op Note (Addendum)
[place] Patient Name: Chelsea Bennett Procedure Date: 07/05/2022 8:55 AM MRN: [id number] Endoscopist: Jackquline Denmark , MD, [id number] Age: 62 Referring MD:  Date of Birth: Jul 18, 1960 Gender: Female Account #: [id number] Procedure:                Colonoscopy Indications:              Screening for colorectal malignant neoplasm Medicines:                Monitored Anesthesia Care Procedure:                Pre-Anesthesia Assessment:                           - Prior to the procedure, a History and Physical                            was performed, and patient medications and                            allergies were reviewed. The patient's tolerance of                            previous anesthesia was also reviewed. The risks                            and benefits of the procedure and the sedation                            options and risks were discussed with the patient.                            All questions were answered, and informed consent                            was obtained. Prior Anticoagulants: The patient has                            taken no anticoagulant or antiplatelet agents. ASA                            Grade Assessment: II - A patient with mild systemic                            disease. After reviewing the risks and benefits,                            the patient was deemed in satisfactory condition to                            undergo the procedure.                           After obtaining informed consent, the colonoscope  was passed under direct vision. Throughout the                            procedure, the patient's blood pressure, pulse, and                            oxygen saturations were monitored continuously. The                            Olympus [id number] Colonoscope was                            introduced through the anus and advanced to the 2                            cm into the ileum.  The colonoscopy was performed                            without difficulty. The patient tolerated the                            procedure well. The quality of the bowel                            preparation was good. The terminal ileum, ileocecal                            valve, appendiceal orifice, and rectum were                            photographed. Scope In: 9:19:46 AM Scope Out: 9:32:58 AM Scope Withdrawal Time: 0 hours 8 minutes 26 seconds  Total Procedure Duration: 0 hours 13 minutes 12 seconds  Findings:                 A few small-mouthed diverticula were found in the                            sigmoid colon and rare in ascending colon.                           Non-bleeding internal hemorrhoids were found during                            retroflexion. The hemorrhoids were small and Grade                            I (internal hemorrhoids that do not prolapse).                           The terminal ileum appeared normal.                           The exam was otherwise without abnormality on  direct and retroflexion views. Complications:            No immediate complications. Estimated Blood Loss:     Estimated blood loss: none. Impression:               - Mild sigmoid diverticulosis.                           - Non-bleeding internal hemorrhoids.                           - The examined portion of the ileum was normal.                           - The examination was otherwise normal on direct                            and retroflexion views.                           - No specimens collected.                           - The GI Genius (intelligent endoscopy module),                            computer-aided polyp detection system powered by AI                            was utilized to detect colorectal polyps through                            enhanced visualization during colonoscopy. Recommendation:           - Patient has a contact number  available for                            emergencies. The signs and symptoms of potential                            delayed complications were discussed with the                            patient. Return to normal activities tomorrow.                            Written discharge instructions were provided to the                            patient.                           - Resume previous diet.                           - Continue present medications.                           -  Repeat colonoscopy in 10 years for screening                            purposes. Earlier, if with any new problems or                            change in family history.                           - The findings and recommendations were discussed                            with the patient's family. Jackquline Denmark, MD 07/05/2022 9:37:53 AM This report has been signed electronically.

## 2022-07-05 NOTE — Op Note (Signed)
[place] Patient Name: Chelsea Bennett Procedure Date: 07/05/2022 9:01 AM MRN: [id number] Endoscopist: Jackquline Denmark , MD, [id number] Age: 62 Referring MD:  Date of Birth: 1961/02/27 Gender: Female Account #: [id number] Procedure:                Upper GI endoscopy Indications:              Dysphagia Medicines:                Monitored Anesthesia Care Procedure:                Pre-Anesthesia Assessment:                           - Prior to the procedure, a History and Physical                            was performed, and patient medications and                            allergies were reviewed. The patient's tolerance of                            previous anesthesia was also reviewed. The risks                            and benefits of the procedure and the sedation                            options and risks were discussed with the patient.                            All questions were answered, and informed consent                            was obtained. Prior Anticoagulants: The patient has                            taken no anticoagulant or antiplatelet agents. ASA                            Grade Assessment: II - A patient with mild systemic                            disease. After reviewing the risks and benefits,                            the patient was deemed in satisfactory condition to                            undergo the procedure.                           After obtaining informed consent, the endoscope was  passed under direct vision. Throughout the                            procedure, the patient's blood pressure, pulse, and                            oxygen saturations were monitored continuously. The                            Olympus Scope [id number] was introduced through the                            mouth, and advanced to the second part of duodenum.                            The upper GI endoscopy was accomplished  without                            difficulty. The patient tolerated the procedure                            well. Scope In: Scope Out: Findings:                 The examined esophagus was normal. Biopsies were                            obtained from the proximal and distal esophagus                            with cold forceps for histology of suspected                            eosinophilic esophagitis. The scope was withdrawn.                            Dilation was performed with a Maloney dilator with                            no resistance at 50 Fr and 52Fr.                           A small transient hiatal hernia was present-best                            visualized on Valsalva.                           The entire examined stomach was normal. Biopsies                            were taken with a cold forceps for histology.                           The examined duodenum was normal. Complications:  No immediate complications. Estimated Blood Loss:     Estimated blood loss: none. Impression:               - Small hiatal hernia.                           - S/P esophageal dilatation Recommendation:           - Patient has a contact number available for                            emergencies. The signs and symptoms of potential                            delayed complications were discussed with the                            patient. Return to normal activities tomorrow.                            Written discharge instructions were provided to the                            patient.                           - Post dilatation diet.                           - Continue present medications.                           - The findings and recommendations were discussed                            with the patient's family. Jackquline Denmark, MD 07/05/2022 9:35:13 AM This report has been signed electronically.

## 2022-07-05 NOTE — Progress Notes (Signed)
Called to room to assist during endoscopic procedure.  Patient ID and intended procedure confirmed with present staff. Received instructions for my participation in the procedure from the performing physician.  

## 2022-07-05 NOTE — Progress Notes (Signed)
Pt. states no medical or surgical changes since previsit or office visit. 

## 2022-07-05 NOTE — Progress Notes (Signed)
Sedate, gd SR, tolerated procedure well, VSS, report to RN 

## 2022-07-05 NOTE — Patient Instructions (Signed)
Handouts on hiatal hernia, dilation diet, hemorrhoids, and diverticulosis given to you today  Await pathology results  Repeat colonoscopy in 10 years for screening purposes  YOU HAD AN ENDOSCOPIC PROCEDURE TODAY AT [facility]:   Refer to the procedure report that was given to you for any specific questions about what was found during the examination.  If the procedure report does not answer your questions, please call your gastroenterologist to clarify.  If you requested that your care partner not be given the details of your procedure findings, then the procedure report has been included in a sealed envelope for you to review at your convenience later.  YOU SHOULD EXPECT: Some feelings of bloating in the abdomen. Passage of more gas than usual.  Walking can help get rid of the air that was put into your GI tract during the procedure and reduce the bloating. If you had a lower endoscopy (such as a colonoscopy or flexible sigmoidoscopy) you may notice spotting of blood in your stool or on the toilet paper. If you underwent a bowel prep for your procedure, you may not have a normal bowel movement for a few days.  Please Note:  You might notice some irritation and congestion in your nose or some drainage.  This is from the oxygen used during your procedure.  There is no need for concern and it should clear up in a day or so.  SYMPTOMS TO REPORT IMMEDIATELY:  Following lower endoscopy (colonoscopy or flexible sigmoidoscopy):  Excessive amounts of blood in the stool  Significant tenderness or worsening of abdominal pains  Swelling of the abdomen that is new, acute  Fever of 100F or higher  Following upper endoscopy (EGD)  Vomiting of blood or coffee ground material  New chest pain or pain under the shoulder blades  Painful or persistently difficult swallowing  New shortness of breath  Fever of 100F or higher  Black, tarry-looking stools  For urgent or emergent issues, a  gastroenterologist can be reached at any hour by calling [phone]. Do not use MyChart messaging for urgent concerns.    DIET:  We do recommend a small meal at first, but then you may proceed to your regular diet.  Drink plenty of fluids but you should avoid alcoholic beverages for 24 hours.  ACTIVITY:  You should plan to take it easy for the rest of today and you should NOT DRIVE or use heavy machinery until tomorrow (because of the sedation medicines used during the test).    FOLLOW UP: Our staff will call the number listed on your records the next business day following your procedure.  We will call around 7:15- 8:00 am to check on you and address any questions or concerns that you may have regarding the information given to you following your procedure. If we do not reach you, we will leave a message.     If any biopsies were taken you will be contacted by phone or by letter within the next 1-3 weeks.  Please call us at [phone] if you have not heard about the biopsies in 3 weeks.    SIGNATURES/CONFIDENTIALITY: You and/or your care partner have signed paperwork which will be entered into your electronic medical record.  These signatures attest to the fact that that the information above on your After Visit Summary has been reviewed and is understood.  Full responsibility of the confidentiality of this discharge information lies with you and/or your care-partner.

## 2022-07-06 ENCOUNTER — Telehealth: Payer: Self-pay | Admitting: *Deleted

## 2022-07-06 NOTE — Telephone Encounter (Signed)
  Follow up Call-    Row Labels 07/05/2022    8:25 AM 04/29/2020   10:40 AM  Call back number   Section Header. No data exists in this row.    Post procedure Call Back phone  #   [phone]  Permission to leave phone message   Yes Yes     Patient questions:  Do you have a fever, pain , or abdominal swelling? No. Pain Score  0 *  Have you tolerated food without any problems? Yes.    Have you been able to return to your normal activities? Yes.    Do you have any questions about your discharge instructions: Diet   No. Medications  No. Follow up visit  No.  Do you have questions or concerns about your Care? No.  Actions: * If pain score is 4 or above: No action needed, pain <4.

## 2022-07-16 ENCOUNTER — Encounter: Payer: Self-pay | Admitting: Gastroenterology

## 2022-07-26 DIAGNOSIS — M9901 Segmental and somatic dysfunction of cervical region: Secondary | ICD-10-CM | POA: Diagnosis not present

## 2022-07-26 DIAGNOSIS — M9904 Segmental and somatic dysfunction of sacral region: Secondary | ICD-10-CM | POA: Diagnosis not present

## 2022-07-26 DIAGNOSIS — M9902 Segmental and somatic dysfunction of thoracic region: Secondary | ICD-10-CM | POA: Diagnosis not present

## 2022-07-26 DIAGNOSIS — M5388 Other specified dorsopathies, sacral and sacrococcygeal region: Secondary | ICD-10-CM | POA: Diagnosis not present

## 2022-07-26 DIAGNOSIS — M546 Pain in thoracic spine: Secondary | ICD-10-CM | POA: Diagnosis not present

## 2022-07-26 DIAGNOSIS — M47813 Spondylosis without myelopathy or radiculopathy, cervicothoracic region: Secondary | ICD-10-CM | POA: Diagnosis not present

## 2022-08-04 DIAGNOSIS — M6281 Muscle weakness (generalized): Secondary | ICD-10-CM | POA: Diagnosis not present

## 2022-08-04 DIAGNOSIS — E559 Vitamin D deficiency, unspecified: Secondary | ICD-10-CM | POA: Diagnosis not present

## 2022-08-04 DIAGNOSIS — M797 Fibromyalgia: Secondary | ICD-10-CM | POA: Diagnosis not present

## 2022-08-04 DIAGNOSIS — M542 Cervicalgia: Secondary | ICD-10-CM | POA: Diagnosis not present

## 2022-08-04 DIAGNOSIS — E782 Mixed hyperlipidemia: Secondary | ICD-10-CM | POA: Diagnosis not present

## 2022-08-04 DIAGNOSIS — Z6827 Body mass index (BMI) 27.0-27.9, adult: Secondary | ICD-10-CM | POA: Diagnosis not present

## 2022-08-04 DIAGNOSIS — N3281 Overactive bladder: Secondary | ICD-10-CM | POA: Diagnosis not present

## 2022-08-04 DIAGNOSIS — Z780 Asymptomatic menopausal state: Secondary | ICD-10-CM | POA: Diagnosis not present

## 2022-08-04 DIAGNOSIS — G4700 Insomnia, unspecified: Secondary | ICD-10-CM | POA: Diagnosis not present

## 2022-08-07 DIAGNOSIS — M542 Cervicalgia: Secondary | ICD-10-CM | POA: Diagnosis not present

## 2022-08-07 DIAGNOSIS — R200 Anesthesia of skin: Secondary | ICD-10-CM | POA: Diagnosis not present

## 2022-08-11 DIAGNOSIS — R930 Abnormal findings on diagnostic imaging of skull and head, not elsewhere classified: Secondary | ICD-10-CM | POA: Diagnosis not present

## 2022-08-11 DIAGNOSIS — R531 Weakness: Secondary | ICD-10-CM | POA: Diagnosis not present

## 2022-08-11 DIAGNOSIS — M6281 Muscle weakness (generalized): Secondary | ICD-10-CM | POA: Diagnosis not present

## 2022-08-29 DIAGNOSIS — L820 Inflamed seborrheic keratosis: Secondary | ICD-10-CM | POA: Diagnosis not present

## 2022-09-26 DIAGNOSIS — M79671 Pain in right foot: Secondary | ICD-10-CM | POA: Diagnosis not present

## 2022-09-26 DIAGNOSIS — M6281 Muscle weakness (generalized): Secondary | ICD-10-CM | POA: Diagnosis not present

## 2022-09-26 DIAGNOSIS — M79672 Pain in left foot: Secondary | ICD-10-CM | POA: Diagnosis not present

## 2022-09-26 DIAGNOSIS — F418 Other specified anxiety disorders: Secondary | ICD-10-CM | POA: Diagnosis not present

## 2022-09-26 DIAGNOSIS — Z6829 Body mass index (BMI) 29.0-29.9, adult: Secondary | ICD-10-CM | POA: Diagnosis not present

## 2022-10-11 DIAGNOSIS — L570 Actinic keratosis: Secondary | ICD-10-CM | POA: Diagnosis not present

## 2022-10-11 DIAGNOSIS — M79672 Pain in left foot: Secondary | ICD-10-CM | POA: Diagnosis not present

## 2022-10-11 DIAGNOSIS — B070 Plantar wart: Secondary | ICD-10-CM | POA: Diagnosis not present

## 2022-11-06 DIAGNOSIS — M9901 Segmental and somatic dysfunction of cervical region: Secondary | ICD-10-CM | POA: Diagnosis not present

## 2022-11-06 DIAGNOSIS — M9904 Segmental and somatic dysfunction of sacral region: Secondary | ICD-10-CM | POA: Diagnosis not present

## 2022-11-06 DIAGNOSIS — M545 Low back pain, unspecified: Secondary | ICD-10-CM | POA: Diagnosis not present

## 2022-11-06 DIAGNOSIS — G8929 Other chronic pain: Secondary | ICD-10-CM | POA: Diagnosis not present

## 2022-11-06 DIAGNOSIS — M9903 Segmental and somatic dysfunction of lumbar region: Secondary | ICD-10-CM | POA: Diagnosis not present

## 2022-11-06 DIAGNOSIS — M47816 Spondylosis without myelopathy or radiculopathy, lumbar region: Secondary | ICD-10-CM | POA: Diagnosis not present

## 2022-11-06 DIAGNOSIS — M47892 Other spondylosis, cervical region: Secondary | ICD-10-CM | POA: Diagnosis not present

## 2022-11-06 DIAGNOSIS — M47817 Spondylosis without myelopathy or radiculopathy, lumbosacral region: Secondary | ICD-10-CM | POA: Diagnosis not present

## 2022-11-07 DIAGNOSIS — M9901 Segmental and somatic dysfunction of cervical region: Secondary | ICD-10-CM | POA: Diagnosis not present

## 2022-11-07 DIAGNOSIS — M47892 Other spondylosis, cervical region: Secondary | ICD-10-CM | POA: Diagnosis not present

## 2022-11-07 DIAGNOSIS — M9904 Segmental and somatic dysfunction of sacral region: Secondary | ICD-10-CM | POA: Diagnosis not present

## 2022-11-07 DIAGNOSIS — M47816 Spondylosis without myelopathy or radiculopathy, lumbar region: Secondary | ICD-10-CM | POA: Diagnosis not present

## 2022-11-07 DIAGNOSIS — M47817 Spondylosis without myelopathy or radiculopathy, lumbosacral region: Secondary | ICD-10-CM | POA: Diagnosis not present

## 2022-11-07 DIAGNOSIS — M9903 Segmental and somatic dysfunction of lumbar region: Secondary | ICD-10-CM | POA: Diagnosis not present

## 2022-11-23 ENCOUNTER — Ambulatory Visit: Payer: PPO | Admitting: Neurology

## 2022-11-27 DIAGNOSIS — H43812 Vitreous degeneration, left eye: Secondary | ICD-10-CM | POA: Diagnosis not present

## 2022-12-05 DIAGNOSIS — K219 Gastro-esophageal reflux disease without esophagitis: Secondary | ICD-10-CM | POA: Diagnosis not present

## 2022-12-05 DIAGNOSIS — F418 Other specified anxiety disorders: Secondary | ICD-10-CM | POA: Diagnosis not present

## 2022-12-05 DIAGNOSIS — M797 Fibromyalgia: Secondary | ICD-10-CM | POA: Diagnosis not present

## 2022-12-05 DIAGNOSIS — E785 Hyperlipidemia, unspecified: Secondary | ICD-10-CM | POA: Diagnosis not present

## 2022-12-05 DIAGNOSIS — F5104 Psychophysiologic insomnia: Secondary | ICD-10-CM | POA: Diagnosis not present

## 2022-12-05 DIAGNOSIS — M8589 Other specified disorders of bone density and structure, multiple sites: Secondary | ICD-10-CM | POA: Diagnosis not present

## 2022-12-05 DIAGNOSIS — N3281 Overactive bladder: Secondary | ICD-10-CM | POA: Diagnosis not present

## 2022-12-05 DIAGNOSIS — R5383 Other fatigue: Secondary | ICD-10-CM | POA: Diagnosis not present

## 2022-12-08 DIAGNOSIS — M79672 Pain in left foot: Secondary | ICD-10-CM | POA: Diagnosis not present

## 2022-12-08 DIAGNOSIS — L84 Corns and callosities: Secondary | ICD-10-CM | POA: Diagnosis not present

## 2022-12-08 DIAGNOSIS — M792 Neuralgia and neuritis, unspecified: Secondary | ICD-10-CM | POA: Diagnosis not present

## 2022-12-11 ENCOUNTER — Ambulatory Visit: Payer: PPO | Admitting: Diagnostic Neuroimaging

## 2022-12-13 ENCOUNTER — Ambulatory Visit: Payer: Medicare HMO | Admitting: Diagnostic Neuroimaging

## 2022-12-13 ENCOUNTER — Encounter: Payer: Self-pay | Admitting: Diagnostic Neuroimaging

## 2022-12-13 VITALS — BP 118/76 | HR 72 | Ht 63.0 in | Wt 167.0 lb

## 2022-12-13 DIAGNOSIS — M791 Myalgia, unspecified site: Secondary | ICD-10-CM

## 2022-12-13 DIAGNOSIS — R4189 Other symptoms and signs involving cognitive functions and awareness: Secondary | ICD-10-CM

## 2022-12-13 DIAGNOSIS — G4709 Other insomnia: Secondary | ICD-10-CM | POA: Diagnosis not present

## 2022-12-13 DIAGNOSIS — F411 Generalized anxiety disorder: Secondary | ICD-10-CM

## 2022-12-13 NOTE — Patient Instructions (Signed)
FATIGUE, ANXIETY, INSOMNIA, FIBROMYALGIA - follow up with psychiatry / psychology - safety / supervision issues reviewed - daily physical activity / exercise (at least 15-30 minutes) - eat more plants / vegetables - increase social activities, brain stimulation, games, puzzles, hobbies, crafts, arts, music - aim for at least 7-8 hours sleep per night (or more) - avoid smoking and alcohol - caution with medications, finances, driving  ABNL MRI BRAIN  - likely incidental finding; recommend to repeat imaging in 3-6 months with MRI brain w/wo, cervical spine w/wo to rule out progression for autoimmune / inflamm conditions

## 2022-12-13 NOTE — Progress Notes (Signed)
[facility]  PATIENT: Chelsea Bennett DOB: [birth year]  REFERRING CLINICIAN: Mikki Santee Key, * HISTORY FROM: patient  REASON FOR VISIT: new consult   HISTORICAL  CHIEF COMPLAINT:  Chief Complaint  Patient presents with   New Patient (Initial Visit)    Rm 6, here with husband Dan  Pt is referred for abnormal brain MRI, muscle weakness and fatigue.     HISTORY OF PRESENT ILLNESS:   62 year old female here for evaluation of myalgia, fatigue, insomnia, MRI results.  Since age 51 years old patient has had longstanding anxiety, insomnia, fibromyalgia symptoms.  She was treated conservatively over the years.  At some point she did see psychiatry and psychology.  Symptoms were fairly stable over the years until 3 to 6 months ago.  Significant increase in fatigue, insomnia, brain fog, weakness anxiety.  Of note patient was switched from Xanax to clonazepam about 1 year ago.  Patient went to PCP for evaluation.  MRI of the brain was obtained which showed nonspecific T2 hyperintensities.  Patient referred here for further evaluation.   REVIEW OF SYSTEMS: Full 14 system review of systems performed and negative with exception of: As per HPI.  ALLERGIES: Allergies  Allergen Reactions   Aminophylline Rash   Amitriptyline Rash   Citalopram Rash   Clarithromycin Rash   Fluoxetine Rash   Latex Dermatitis   Olanzapine Rash   Other Dermatitis   Oxycodone Itching   Paroxetine Hcl Rash   Tioconazole Rash   Triamterene Rash   Triazolam Rash    HOME MEDICATIONS: Outpatient Medications Prior to Visit  Medication Sig Dispense Refill   Calcium Carb-Cholecalciferol (CALCIUM 1000 + D) 1000-800 MG-UNIT TABS Take by mouth.     Calcium-Vitamin D (CALTRATE 600 PLUS-VIT D PO) Take by mouth.     cetirizine (ZYRTEC) 10 MG tablet Take 10 mg by mouth daily as needed.     Cholecalciferol (VITAMIN D3) 2000 units TABS Take by mouth.     CLONAZEPAM PO Take by mouth as  needed.     Cyanocobalamin (VITAMIN B-12 PO) Take by mouth daily in the afternoon.     esomeprazole (NEXIUM) 40 MG capsule Take 1 capsule (40 mg total) by mouth daily. 90 capsule 4   estradiol (ESTRACE) 0.5 MG tablet Take 0.5 mg by mouth daily.     Eszopiclone 3 MG TABS Take 3 mg by mouth at bedtime.  5   ibuprofen (ADVIL) 200 MG tablet Take 200 mg by mouth every 6 (six) hours as needed.     methocarbamol (ROBAXIN) 750 MG tablet TAKE ONE TABLET BY MOUTH THREE TIMES DAILY     mometasone (NASONEX) 50 MCG/ACT nasal spray Place 2 sprays into the nose daily.     NON FORMULARY 1,250 mg daily. Magnesium malate     NON FORMULARY Progesterone SR 100 mg      Take 2 capsules nightly     NON FORMULARY Biest / Dhea                  1 gel daily     OVER THE COUNTER MEDICATION ANXIO CALM FOR ANXIETY     oxybutynin (DITROPAN) 5 MG tablet Take 2.5 mg by mouth 2 (two) times daily.     progesterone (PROMETRIUM) 100 MG capsule Take 100 mg by mouth at bedtime.     traMADol (ULTRAM) 50 MG tablet Take 50 mg by mouth every 6 (six) hours as needed.     vitamin E 1000 UNIT capsule  Take by mouth.     Vitamin E 670 MG (1000 UT) CAPS Take by mouth.     Eszopiclone 3 MG TABS Take 3 mg by mouth at bedtime.     glucosamine-chondroitin 500-400 MG tablet Take 1 tablet by mouth 3 (three) times daily.     sucralfate (CARAFATE) 1 GM/10ML suspension Take 10 mLs (1 g total) by mouth at bedtime. 300 mL 3   Facility-Administered Medications Prior to Visit  Medication Dose Route Frequency Provider Last Rate Last Admin   0.9 %  sodium chloride infusion  500 mL Intravenous Continuous Lynann Bologna, MD        PAST MEDICAL HISTORY: Past Medical History:  Diagnosis Date   Abnormal stress test 04/06/2016   Formatting of this note might be different from the original. Added automatically from request for surgery [id number] Formatting of this note might be different from the original. Overview:  Added automatically from request for  surgery [phone]   Allergy    Anemia    with pregnancy   Anxiety    Arthritis    Cataract    removed bilateraly   Depression    Dysphagia    Exertional chest pain 04/06/2016   Formatting of this note might be different from the original. Added automatically from request for surgery [id number] Formatting of this note might be different from the original. Overview:  Added automatically from request for surgery [id number]   Fibromyalgia    GERD (gastroesophageal reflux disease)    Hyperlipidemia    Vitamin B12 deficiency     PAST SURGICAL HISTORY: Past Surgical History:  Procedure Laterality Date   ABDOMINAL HYSTERECTOMY     APPENDECTOMY     CATARACT EXTRACTION     CESAREAN SECTION     CHOLECYSTECTOMY     COLONOSCOPY  12/13/2011   Mild sigmoid diverticulosis. Small internal hemorrhoids. Otherwise noraml TI.    COLONOSCOPY     july 2024   ESOPHAGOGASTRODUODENOSCOPY  12/13/2011   Minimal hiatal hernia. Mild gastrtiis.    HEMORRHOID SURGERY     SHOULDER SURGERY      FAMILY HISTORY: Family History  Problem Relation Age of Onset   Pancreatic cancer Mother    Prostate cancer Father    Colon cancer Neg Hx    Stomach cancer Neg Hx    Rectal cancer Neg Hx    Esophageal cancer Neg Hx     SOCIAL HISTORY: Social History   Socioeconomic History   Marital status: Married    Spouse name: Not on file   Number of children: Not on file   Years of education: Not on file   Highest education level: Not on file  Occupational History   Not on file  Tobacco Use   Smoking status: Never   Smokeless tobacco: Never  Vaping Use   Vaping status: Never Used  Substance and Sexual Activity   Alcohol use: Never   Drug use: Never   Sexual activity: Not on file  Other Topics Concern   Not on file  Social History Narrative   Not on file   Social Determinants of Health   Financial Resource Strain: Not on file  Food Insecurity: Not on file  Transportation Needs: Not on file  Physical  Activity: Not on file  Stress: Not on file  Social Connections: Not on file  Intimate Partner Violence: Not on file     PHYSICAL EXAM  GENERAL EXAM/CONSTITUTIONAL: Vitals:  Vitals:   12/13/22 1102  BP: 118/76  Pulse: 72  Weight: 167 lb (75.8 kg)  Height: 5\' 3"  (1.6 m)   Body mass index is 29.58 kg/m. Wt Readings from Last 3 Encounters:  12/13/22 167 lb (75.8 kg)  07/05/22 166 lb (75.3 kg)  05/09/22 166 lb 2 oz (75.4 kg)   Patient is in no distress; well developed, nourished and groomed; neck is supple  CARDIOVASCULAR: Examination of carotid arteries is normal; no carotid bruits Regular rate and rhythm, no murmurs Examination of peripheral vascular system by observation and palpation is normal  EYES: Ophthalmoscopic exam of optic discs and posterior segments is normal; no papilledema or hemorrhages No results found.  MUSCULOSKELETAL: Gait, strength, tone, movements noted in Neurologic exam below  NEUROLOGIC: MENTAL STATUS:      No data to display         awake, alert, oriented to person, place and time recent and remote memory intact normal attention and concentration language fluent, comprehension intact, naming intact fund of knowledge appropriate  CRANIAL NERVE:  2nd - no papilledema on fundoscopic exam 2nd, 3rd, 4th, 6th - pupils equal and reactive to light, visual fields full to confrontation, extraocular muscles intact, no nystagmus 5th - facial sensation symmetric 7th - facial strength symmetric 8th - hearing intact 9th - palate elevates symmetrically, uvula midline 11th - shoulder shrug symmetric 12th - tongue protrusion midline  MOTOR:  normal bulk and tone, full strength in the BUE, BLE  SENSORY:  normal and symmetric to light touch, temperature, vibration  COORDINATION:  finger-nose-finger, fine finger movements normal  REFLEXES:  deep tendon reflexes 1+ and symmetric  GAIT/STATION:  narrow based gait     DIAGNOSTIC DATA  (LABS, IMAGING, TESTING) - I reviewed patient records, labs, notes, testing and imaging myself where available.  No results found for: "WBC", "HGB", "HCT", "MCV", "PLT"    Component Value Date/Time   NA 139 03/01/2020 1049   K 4.1 03/01/2020 1049   CL 104 03/01/2020 1049   CO2 23 03/01/2020 1049   GLUCOSE 79 03/01/2020 1049   BUN 15 03/01/2020 1049   CREATININE 0.61 03/01/2020 1049   CALCIUM 9.1 03/01/2020 1049   GFRNONAA 100 03/01/2020 1049   GFRAA 116 03/01/2020 1049   No results found for: "CHOL", "HDL", "LDLCALC", "LDLDIRECT", "TRIG", "CHOLHDL" No results found for: "HGBA1C" No results found for: "VITAMINB12" No results found for: "TSH"  08/11/22 MRI brain [I reviewed images myself and agree with interpretation. -VRP]  1. No evidence of an acute intracranial abnormality.  2. Multifocal T2 FLAIR hyperintense signal abnormality within the cerebral white matter, overall mild but greater than expected for age. These signal changes are nonspecific and differential considerations include chronic small vessel ischemic disease, sequelae of demyelinating disease, sequelae of chronic migraine headaches or sequelae of a prior infectious/inflammatory process, among others.    ASSESSMENT AND PLAN  62 y.o. year old female here with:   Dx:  1. Myalgia   2. Brain fog   3. Other insomnia   4. Generalized anxiety disorder     PLAN:  FATIGUE, ANXIETY, INSOMNIA, FIBROMYALGIA - follow up with psychiatry / psychology - safety / supervision issues reviewed - daily physical activity / exercise (at least 15-30 minutes) - eat more plants / vegetables - increase social activities, brain stimulation, games, puzzles, hobbies, crafts, arts, music - aim for at least 7-8 hours sleep per night (or more) - avoid smoking and alcohol - caution with medications, finances, driving  ABNL MRI BRAIN  - likely incidental finding; recommend  to repeat imaging in 3-6 months with MRI brain w/wo, cervical  spine w/wo to rule out progression for autoimmune / inflamm conditions  Orders Placed This Encounter  Procedures   Vitamin B12   ANA,IFA RA Diag Pnl w/rflx Tit/Patn   Hemoglobin A1c   TSH Rfx on Abnormal to Free T4   CK   Aldolase   Return for pending test results, pending if symptoms worsen or fail to improve.    Suanne Marker, MD 12/13/2022, 11:46 AM Certified in Neurology, Neurophysiology and Neuroimaging  [facility] [address], [address] [place], [place] [address] [phone]

## 2022-12-14 LAB — TSH RFX ON ABNORMAL TO FREE T4: TSH: 1.58 u[IU]/mL (ref 0.450–4.500)

## 2022-12-17 LAB — ANA,IFA RA DIAG PNL W/RFLX TIT/PATN
ANA Titer 1: NEGATIVE
Cyclic Citrullin Peptide Ab: 9 U (ref 0–19)
Rheumatoid fact SerPl-aCnc: <10 [IU]/mL (ref <14.0)

## 2022-12-17 LAB — HEMOGLOBIN A1C
Est. average glucose Bld gHb Est-mCnc: 103 mg/dL
Hgb A1c MFr Bld: 5.2 % (ref 4.8–5.6)

## 2022-12-17 LAB — CK: Total CK: 78 U/L (ref 32–182)

## 2022-12-17 LAB — ALDOLASE: Aldolase: 4.9 U/L (ref 3.3–10.3)

## 2022-12-17 LAB — VITAMIN B12: Vitamin B-12: 1130 pg/mL (ref 232–1245)

## 2022-12-19 DIAGNOSIS — Z01419 Encounter for gynecological examination (general) (routine) without abnormal findings: Secondary | ICD-10-CM | POA: Diagnosis not present

## 2022-12-28 DIAGNOSIS — Z79890 Hormone replacement therapy (postmenopausal): Secondary | ICD-10-CM | POA: Diagnosis not present

## 2022-12-28 DIAGNOSIS — M8589 Other specified disorders of bone density and structure, multiple sites: Secondary | ICD-10-CM | POA: Diagnosis not present

## 2023-01-29 DIAGNOSIS — R1032 Left lower quadrant pain: Secondary | ICD-10-CM | POA: Diagnosis not present

## 2023-01-29 DIAGNOSIS — R110 Nausea: Secondary | ICD-10-CM | POA: Diagnosis not present

## 2023-01-29 DIAGNOSIS — I700 Atherosclerosis of aorta: Secondary | ICD-10-CM | POA: Diagnosis not present

## 2023-01-29 DIAGNOSIS — N200 Calculus of kidney: Secondary | ICD-10-CM | POA: Diagnosis not present

## 2023-01-29 DIAGNOSIS — M545 Low back pain, unspecified: Secondary | ICD-10-CM | POA: Diagnosis not present

## 2023-01-31 DIAGNOSIS — K5790 Diverticulosis of intestine, part unspecified, without perforation or abscess without bleeding: Secondary | ICD-10-CM | POA: Diagnosis not present

## 2023-01-31 DIAGNOSIS — N200 Calculus of kidney: Secondary | ICD-10-CM | POA: Diagnosis not present

## 2023-01-31 DIAGNOSIS — R1032 Left lower quadrant pain: Secondary | ICD-10-CM | POA: Diagnosis not present

## 2023-01-31 DIAGNOSIS — I700 Atherosclerosis of aorta: Secondary | ICD-10-CM | POA: Diagnosis not present

## 2023-01-31 DIAGNOSIS — N281 Cyst of kidney, acquired: Secondary | ICD-10-CM | POA: Diagnosis not present

## 2023-02-03 ENCOUNTER — Encounter (HOSPITAL_COMMUNITY): Payer: Self-pay

## 2023-02-03 ENCOUNTER — Emergency Department (HOSPITAL_COMMUNITY): Payer: Medicare HMO

## 2023-02-03 ENCOUNTER — Emergency Department (HOSPITAL_COMMUNITY)
Admission: EM | Admit: 2023-02-03 | Discharge: 2023-02-03 | Disposition: A | Discharge status: Home / Self Care | Payer: Medicare HMO

## 2023-02-03 ENCOUNTER — Other Ambulatory Visit: Payer: Self-pay

## 2023-02-03 DIAGNOSIS — R110 Nausea: Secondary | ICD-10-CM | POA: Diagnosis not present

## 2023-02-03 DIAGNOSIS — Z91040 Latex allergy status: Secondary | ICD-10-CM | POA: Insufficient documentation

## 2023-02-03 DIAGNOSIS — R109 Unspecified abdominal pain: Secondary | ICD-10-CM | POA: Diagnosis not present

## 2023-02-03 DIAGNOSIS — R1032 Left lower quadrant pain: Secondary | ICD-10-CM | POA: Diagnosis not present

## 2023-02-03 DIAGNOSIS — N281 Cyst of kidney, acquired: Secondary | ICD-10-CM | POA: Diagnosis not present

## 2023-02-03 DIAGNOSIS — R1030 Lower abdominal pain, unspecified: Secondary | ICD-10-CM

## 2023-02-03 DIAGNOSIS — K5730 Diverticulosis of large intestine without perforation or abscess without bleeding: Secondary | ICD-10-CM | POA: Diagnosis not present

## 2023-02-03 LAB — CBC
HCT: 43.9 % (ref 36.0–46.0)
Hemoglobin: 14.9 g/dL (ref 12.0–15.0)
MCH: 30.4 pg (ref 26.0–34.0)
MCHC: 33.9 g/dL (ref 30.0–36.0)
MCV: 89.6 fL (ref 80.0–100.0)
Platelets: 339 10*3/uL (ref 150–400)
RBC: 4.9 MIL/uL (ref 3.87–5.11)
RDW: 12.3 % (ref 11.5–15.5)
WBC: 10.7 10*3/uL — ABNORMAL HIGH (ref 4.0–10.5)
nRBC: 0 % (ref 0.0–0.2)

## 2023-02-03 LAB — COMPREHENSIVE METABOLIC PANEL
ALT: 21 U/L (ref 0–44)
AST: 19 U/L (ref 15–41)
Albumin: 3.9 g/dL (ref 3.5–5.0)
Alkaline Phosphatase: 51 U/L (ref 38–126)
Anion gap: 9 (ref 5–15)
BUN: 17 mg/dL (ref 8–23)
CO2: 24 mmol/L (ref 22–32)
Calcium: 9.2 mg/dL (ref 8.9–10.3)
Chloride: 101 mmol/L (ref 98–111)
Creatinine, Ser: 0.52 mg/dL (ref 0.44–1.00)
GFR, Estimated: >60 mL/min (ref >60)
Glucose, Bld: 88 mg/dL (ref 70–99)
Potassium: 3.7 mmol/L (ref 3.5–5.1)
Sodium: 134 mmol/L — ABNORMAL LOW (ref 135–145)
Total Bilirubin: 0.9 mg/dL (ref 0.3–1.2)
Total Protein: 7.5 g/dL (ref 6.5–8.1)

## 2023-02-03 LAB — URINALYSIS, ROUTINE W REFLEX MICROSCOPIC
Bilirubin Urine: NEGATIVE
Glucose, UA: NEGATIVE mg/dL
Hgb urine dipstick: NEGATIVE
Ketones, ur: NEGATIVE mg/dL
Leukocytes,Ua: NEGATIVE
Nitrite: NEGATIVE
Protein, ur: NEGATIVE mg/dL
Specific Gravity, Urine: 1.006 (ref 1.005–1.030)
pH: 7 (ref 5.0–8.0)

## 2023-02-03 LAB — LIPASE, BLOOD: Lipase: 33 U/L (ref 11–51)

## 2023-02-03 MED ORDER — IOHEXOL 300 MG/ML  SOLN
100.0000 mL | Freq: Once | INTRAMUSCULAR | Status: AC | PRN
  Administered 2023-02-03: 100 mL via INTRAVENOUS

## 2023-02-03 MED ORDER — ALUM & MAG HYDROXIDE-SIMETH 200-200-20 MG/5ML PO SUSP
30.0000 mL | Freq: Once | ORAL | Status: AC
  Administered 2023-02-03: 30 mL via ORAL
  Filled 2023-02-03: qty 30

## 2023-02-03 MED ORDER — HYDROMORPHONE HCL 1 MG/ML IJ SOLN
0.5000 mg | Freq: Once | INTRAMUSCULAR | Status: AC
  Administered 2023-02-03: 0.5 mg via INTRAVENOUS
  Filled 2023-02-03: qty 1

## 2023-02-03 MED ORDER — LIDOCAINE 4 % EX PTCH: 1.0000 | MEDICATED_PATCH | CUTANEOUS | 0 refills | Status: AC

## 2023-02-03 MED ORDER — GABAPENTIN 300 MG PO CAPS
300.0000 mg | ORAL_CAPSULE | Freq: Three times a day (TID) | ORAL | 0 refills | Status: AC
Start: 2023-02-03 — End: 2023-09-13

## 2023-02-03 MED ORDER — ONDANSETRON HCL 4 MG/2ML IJ SOLN
4.0000 mg | Freq: Once | INTRAMUSCULAR | Status: AC
  Administered 2023-02-03: 4 mg via INTRAVENOUS
  Filled 2023-02-03: qty 2

## 2023-02-03 NOTE — ED Triage Notes (Signed)
Patient has had left sided flank pain that wrapped around to her left lower quadrant for a month. She feels a burning sensation in her stomach. Vomited x1.

## 2023-02-03 NOTE — ED Provider Notes (Signed)
[place] EMERGENCY DEPARTMENT AT [facility] Provider Note   CSN: [id number] Arrival date & time: 02/03/23  1407     History  Chief Complaint  Patient presents with   Abdominal Pain    Chelsea Bennett is a 62 y.o. female.  62 year old female presenting emergency department for left lower abdominal pain.  She states that she has had symptoms for close to a month now.  Seemingly started in left flank, has migrated to the left lower quadrant.  Described as burning.  She is nauseated, but no vomiting.  Having normal bowel movements.  No fevers.  No dysuria.  Denies prior surgeries.   Abdominal Pain      Home Medications Prior to Admission medications   Medication Sig Start Date End Date Taking? Authorizing Provider  gabapentin (NEURONTIN) 300 MG capsule Take 1 capsule (300 mg total) by mouth 3 (three) times daily for 14 days. 02/03/23 02/17/23 Yes Mackenzi Krogh, Feliz Beam J, DO  lidocaine (HM LIDOCAINE PATCH) 4 % Place 1 patch onto the skin daily for 7 days. 02/03/23 02/10/23 Yes Braxden Lovering, Harmon Dun, DO  Calcium Carb-Cholecalciferol (CALCIUM 1000 + D) 1000-800 MG-UNIT TABS Take by mouth.    [provider]  Calcium-Vitamin D (CALTRATE 600 PLUS-VIT D PO) Take by mouth.    [provider]  cetirizine (ZYRTEC) 10 MG tablet Take 10 mg by mouth daily as needed.    [provider]  Cholecalciferol (VITAMIN D3) 2000 units TABS Take by mouth.    [provider]  CLONAZEPAM PO Take by mouth as needed.    [provider]  Cyanocobalamin (VITAMIN B-12 PO) Take by mouth daily in the afternoon.    [provider]  esomeprazole (NEXIUM) 40 MG capsule Take 1 capsule (40 mg total) by mouth daily. 05/09/22   Lynann Bologna, MD  estradiol (ESTRACE) 0.5 MG tablet Take 0.5 mg by mouth daily.    [provider]  Eszopiclone 3 MG TABS Take 3 mg by mouth at bedtime. 07/27/17   [provider]  ibuprofen (ADVIL) 200 MG tablet Take 200 mg by  mouth every 6 (six) hours as needed.    [provider]  methocarbamol (ROBAXIN) 750 MG tablet TAKE ONE TABLET BY MOUTH THREE TIMES DAILY 05/17/15   [provider]  mometasone (NASONEX) 50 MCG/ACT nasal spray Place 2 sprays into the nose daily.    [provider]  NON FORMULARY 1,250 mg daily. Magnesium malate    [provider]  NON FORMULARY Progesterone SR 100 mg      Take 2 capsules nightly    [provider]  NON FORMULARY Biest / Dhea                  1 gel daily    [provider]  OVER THE COUNTER MEDICATION ANXIO CALM FOR ANXIETY    [provider]  oxybutynin (DITROPAN) 5 MG tablet Take 2.5 mg by mouth 2 (two) times daily. 10/23/16   [provider]  progesterone (PROMETRIUM) 100 MG capsule Take 100 mg by mouth at bedtime.    [provider]  traMADol (ULTRAM) 50 MG tablet Take 50 mg by mouth every 6 (six) hours as needed.    [provider]  vitamin E 1000 UNIT capsule Take by mouth.    [provider]  Vitamin E 670 MG (1000 UT) CAPS Take by mouth. 09/13/16   [provider]      Allergies  Aminophylline, Amitriptyline, Citalopram, Clarithromycin, Fluoxetine, Latex, Olanzapine, Other, Oxycodone, Paroxetine hcl, Tioconazole, Triamterene, and Triazolam    Review of Systems   Review of Systems  Gastrointestinal:  Positive for abdominal pain.    Physical Exam Updated Vital Signs BP (!) 167/104 (BP Location: Left Arm)   Pulse 76   Temp 98.4 F (36.9 C) (Oral)   Resp 16   Ht 5\' 3"  (1.6 m)   Wt 76.7 kg   SpO2 98%   BMI 29.94 kg/m  Physical Exam Vitals and nursing note reviewed.  Constitutional:      General: She is not in acute distress.    Appearance: She is not toxic-appearing.  HENT:     Head: Normocephalic.  Cardiovascular:     Rate and Rhythm: Normal rate and regular rhythm.  Pulmonary:     Effort: Pulmonary effort is normal.     Breath sounds: Normal  breath sounds.  Abdominal:     General: Abdomen is flat.     Tenderness: There is abdominal tenderness in the suprapubic area and left lower quadrant. Positive psoas sign: minor.  Skin:    General: Skin is warm and dry.     Capillary Refill: Capillary refill takes less than 2 seconds.  Neurological:     Mental Status: She is alert and oriented to person, place, and time.  Psychiatric:        Mood and Affect: Mood normal.        Behavior: Behavior normal.     ED Results / Procedures / Treatments   Labs (all labs ordered are listed, but only abnormal results are displayed) Labs Reviewed  COMPREHENSIVE METABOLIC PANEL - Abnormal; Notable for the following components:      Result Value   Sodium 134 (*)    All other components within normal limits  CBC - Abnormal; Notable for the following components:   WBC 10.7 (*)    All other components within normal limits  LIPASE, BLOOD  URINALYSIS, ROUTINE W REFLEX MICROSCOPIC    EKG None  Radiology CT ABDOMEN PELVIS W CONTRAST  Result Date: 02/03/2023 CLINICAL DATA:  Abdominal pain. Vomiting. Diverticulitis complication suspected. EXAM: CT ABDOMEN AND PELVIS WITH CONTRAST TECHNIQUE: Multidetector CT imaging of the abdomen and pelvis was performed using the standard protocol following bolus administration of intravenous contrast. RADIATION DOSE REDUCTION: This exam was performed according to the departmental dose-optimization program which includes automated exposure control, adjustment of the mA and/or kV according to patient size and/or use of iterative reconstruction technique. CONTRAST:  OMNIPAQUE IOHEXOL 300 MG/ML  SOLN COMPARISON:  CT of the abdomen pelvis dated 01/31/2023. FINDINGS: Lower chest: The visualized lung bases are clear. No intra-abdominal free air or free fluid. Hepatobiliary: The liver is unremarkable. No biliary dilatation. Cholecystectomy. Pancreas: Unremarkable. No pancreatic ductal dilatation or surrounding  inflammatory changes. Spleen: Normal in size without focal abnormality. Adrenals/Urinary Tract: The adrenal glands are unremarkable. There is a 2 cm left renal interpolar cyst. There is no hydronephrosis on either side. There is symmetric enhancement and excretion of contrast by both kidneys. The visualized ureters and urinary bladder appear unremarkable. Stomach/Bowel: There is mild distal colonic diverticulosis without active inflammatory changes. There is no bowel obstruction or active inflammation. Appendectomy. Vascular/Lymphatic: Mild aortoiliac atherosclerotic disease. The IVC is unremarkable. No portal venous gas. There is no adenopathy. Reproductive: Hysterectomy.  No adnexal masses. Other: None Musculoskeletal: No acute or significant osseous findings. IMPRESSION: 1. No acute intra-abdominal or pelvic pathology. 2. Mild distal colonic diverticulosis.  No bowel obstruction. 3.  Aortic Atherosclerosis (ICD10-I70.0). Electronically Signed   By: Elgie Collard M.D.   On: 02/03/2023 16:37    Procedures Procedures    Medications Ordered in ED Medications  HYDROmorphone (DILAUDID) injection 0.5 mg (0.5 mg Intravenous Given 02/03/23 1639)  alum & mag hydroxide-simeth (MAALOX/MYLANTA) 200-200-20 MG/5ML suspension 30 mL (30 mLs Oral Given 02/03/23 1638)  ondansetron (ZOFRAN) injection 4 mg (4 mg Intravenous Given 02/03/23 1639)  iohexol (OMNIPAQUE) 300 MG/ML solution 100 mL (100 mLs Intravenous Contrast Given 02/03/23 1607)    ED Course/ Medical Decision Making/ A&P Clinical Course as of 02/03/23 2345  Sat Feb 03, 2023  1725 Patient reevaluated feeling somewhat improved after medications.  Workup of very mild leukocytosis which is nonspecific.  Comprehensive metabolic panel with no significant metabolic derangements.  No transaminitis to suggest hepatobiliary disease.  Lipase normal.  Pancreatitis unlikely.  UA negative for UTI.  CT scan with no acute findings that would explain patient's symptoms  today.  Suspect musculoskeletal etiology.  However patient does have documented fibromyalgia.  Will discharge with gabapentin and lidocaine patch.  Patient to follow-up with primary doctor.  Return precautions given. [TY]    Clinical Course User Index [TY] Coral Spikes, DO                                 Medical Decision Making 62 year old female present emergency department for left lower quadrant abdominal pain.  She is afebrile nontachycardic slightly hypertensive.  Maintaining oxygen saturation on room air.  Physical exam; with some minor tenderness to the left lower quadrant.  No CVA tenderness.  She does appear uncomfortable.  Patient's husband notes that they had CT scan 5 days ago after seeing primary doctor, but are unsure the results at this time.  This was done at [facility].  They do note that symptoms have seemingly worsened over the past 5 days.  Unfortunately, per my chart review open unable to locate records of the CT scan. With shared decision-making with patient has been will repeat CT scan given her worsening symptoms.  Will get broad screening labs.  Will give IV Dilaudid and Zofran, GI cocktail for symptomatic relief.  See ED course for final MDM and disposition.  Amount and/or Complexity of Data Reviewed Labs: ordered. Radiology: ordered.  Risk OTC drugs. Prescription drug management.         Final Clinical Impression(s) / ED Diagnoses Final diagnoses:  Lower abdominal pain    Rx / DC Orders ED Discharge Orders          Ordered    lidocaine (HM LIDOCAINE PATCH) 4 %  Every 24 hours        02/03/23 1728    gabapentin (NEURONTIN) 300 MG capsule  3 times daily        02/03/23 1728              Coral Spikes, DO 02/03/23 2345

## 2023-02-03 NOTE — ED Notes (Signed)
Patient transported to CT 

## 2023-02-03 NOTE — Discharge Instructions (Addendum)
Please follow-up with your primary doctor.  You may take the medications we have prescribed for symptomatic relief.  Return manage develop fevers, chills, nausea vomiting inability to eat or drink.  Chest pain, shortness of breath, worsening abdominal pain or any new or worsening symptoms that are concerning to you.

## 2023-02-05 DIAGNOSIS — R1032 Left lower quadrant pain: Secondary | ICD-10-CM | POA: Diagnosis not present

## 2023-02-13 ENCOUNTER — Encounter: Payer: Self-pay | Admitting: Gastroenterology

## 2023-02-13 ENCOUNTER — Ambulatory Visit: Payer: Medicare HMO | Admitting: Gastroenterology

## 2023-02-13 VITALS — BP 120/70 | HR 84 | Ht 63.5 in | Wt 169.0 lb

## 2023-02-13 DIAGNOSIS — K581 Irritable bowel syndrome with constipation: Secondary | ICD-10-CM | POA: Diagnosis not present

## 2023-02-13 DIAGNOSIS — K219 Gastro-esophageal reflux disease without esophagitis: Secondary | ICD-10-CM | POA: Diagnosis not present

## 2023-02-13 DIAGNOSIS — K449 Diaphragmatic hernia without obstruction or gangrene: Secondary | ICD-10-CM

## 2023-02-13 DIAGNOSIS — R1032 Left lower quadrant pain: Secondary | ICD-10-CM | POA: Diagnosis not present

## 2023-02-13 DIAGNOSIS — B028 Zoster with other complications: Secondary | ICD-10-CM | POA: Diagnosis not present

## 2023-02-13 MED ORDER — ESOMEPRAZOLE MAGNESIUM 20 MG PO CPDR: 20.0000 mg | DELAYED_RELEASE_CAPSULE | Freq: Every day | ORAL | 1 refills | Status: AC

## 2023-02-13 NOTE — Progress Notes (Signed)
Chief Complaint: FU  Referring Provider:  Dr Tomasa Blase.   ASSESSMENT AND PLAN;   #1. Recent LLQ shingles-better now. Neg CT AP, 02/2023, 01/2022 except for diverticulosis without diverticulitis.  #2. GERD with small hiatal hernia  #3. IBS-C. Neg colonoscopy 07/2022. Rpt 10 yrs  Plan: -Nexium 20mg  po QD #90, 4RF -Miralax 17g po every day -FU as needed.  HPI:    62yr old Accompanied by her husband  Discussed the use of AI scribe software for clinical note transcription with the patient, who gave verbal consent to proceed.  History of Present Illness   The pain was initially localized to the left lower side of the back, but later spread to the stomach and up to the chest. The patient describes the pain as a burning sensation that was so severe it felt like "someone standing there with a torch, torturing me." The pain was associated with nausea and high blood pressure.  The patient sought medical attention and underwent multiple tests including a back x-ray and two CT scans, which were reported as normal. The patient was initially suspected to have kidney stones, but this was ruled out. The patient was then put on gabapentin for pain management.  The patient has a history of shingles and recognized the pain as similar to previous episodes. However, unlike previous episodes, there was no rash. The patient was prescribed medication for shingles, which she finished the day before the current appointment. The patient reports that the pain has significantly improved, but there is still a residual feeling of having been burnt on the inside.  The patient also reports having problems with swallowing and spasms, particularly with capsules. She has a small hiatal hernia and her esophagus was stretched. The patient is currently on Nexium for reflux and requests a lower dose. The patient also reports constipation and has been advised to start Miralax.       Past GI history:  Results    LABS WBC: 10.7 Hb: 14.9 Lipase: 33  RADIOLOGY Back X-ray: Normal Abdominal CT: No acute abnormalities, mild colonic diverticulosis (02/03/2023)  DIAGNOSTIC Colonoscopy: Few diverticulosis, internal hemorrhoids (07/05/2022) Endoscopy: Small hiatal hernia, esophageal dilation (07/05/2022)      EGD 04/29/2020 with dil -Mod gastritis -S/P dilatation 50Fr -Neg Bx for EoE, HP  EGD 08/08/2017 -Small HH -S/P dil 50 Fr -2013: neg small bowel biopsies for celiac  Colonoscopy 12/2011 ([facility]) -Mild sigmoid diverticulosis -Otherwise normal colonoscopy to TI -Repeat in 10 years.  CT AP with contrast 01/03/2022.  Report sent for scanning. -Neg -Diverticulosis without diverticulitis. -S/P hysterectomy, appendicectomy, cholecystectomy.   Past Medical History:  Diagnosis Date   Abnormal stress test 04/06/2016   Formatting of this note might be different from the original. Added automatically from request for surgery [id number] Formatting of this note might be different from the original. Overview:  Added automatically from request for surgery [id number]   Allergy    Anemia    with pregnancy   Anxiety    Arthritis    Cataract    removed bilateraly   COVID    Depression    Dysphagia    Exertional chest pain 04/06/2016   Formatting of this note might be different from the original. Added automatically from request for surgery [id number] Formatting of this note might be different from the original. Overview:  Added automatically from request for surgery [id number]   Fibromyalgia    GERD (gastroesophageal reflux disease)    Hyperlipidemia    Shingles  Vitamin B12 deficiency    Past Surgical History:  Procedure Laterality Date   ABDOMINAL HYSTERECTOMY     APPENDECTOMY     CATARACT EXTRACTION     CESAREAN SECTION     CHOLECYSTECTOMY     COLONOSCOPY  12/13/2011   Mild sigmoid diverticulosis. Small internal hemorrhoids. Otherwise noraml TI.    COLONOSCOPY     july 2024    ESOPHAGOGASTRODUODENOSCOPY  12/13/2011   Minimal hiatal hernia. Mild gastrtiis.    HEMORRHOID SURGERY     SHOULDER SURGERY      Family History  Problem Relation Age of Onset   Pancreatic cancer Mother    Prostate cancer Father    Colon cancer Neg Hx    Stomach cancer Neg Hx    Rectal cancer Neg Hx    Esophageal cancer Neg Hx     Social History   Tobacco Use   Smoking status: Never   Smokeless tobacco: Never  Vaping Use   Vaping status: Never Used  Substance Use Topics   Alcohol use: Never   Drug use: Never    Current Outpatient Medications  Medication Sig Dispense Refill   Calcium Carb-Cholecalciferol (CALCIUM 1000 + D) 1000-800 MG-UNIT TABS Take by mouth.     Calcium-Vitamin D (CALTRATE 600 PLUS-VIT D PO) Take by mouth.     cetirizine (ZYRTEC) 10 MG tablet Take 10 mg by mouth daily as needed.     Cholecalciferol (VITAMIN D3) 2000 units TABS Take by mouth.     CLONAZEPAM PO Take by mouth as needed.     Cyanocobalamin (VITAMIN B-12 PO) Take by mouth daily in the afternoon.     esomeprazole (NEXIUM) 40 MG capsule Take 1 capsule (40 mg total) by mouth daily. 90 capsule 4   estradiol (ESTRACE) 0.5 MG tablet Take 0.5 mg by mouth daily.     Eszopiclone 3 MG TABS Take 3 mg by mouth at bedtime.  5   gabapentin (NEURONTIN) 300 MG capsule Take 1 capsule (300 mg total) by mouth 3 (three) times daily for 14 days. 42 capsule 0   ibuprofen (ADVIL) 200 MG tablet Take 200 mg by mouth every 6 (six) hours as needed.     methocarbamol (ROBAXIN) 750 MG tablet TAKE ONE TABLET BY MOUTH THREE TIMES DAILY     mometasone (NASONEX) 50 MCG/ACT nasal spray Place 2 sprays into the nose daily.     NON FORMULARY 1,250 mg daily. Magnesium malate     NON FORMULARY Progesterone SR 100 mg      Take 2 capsules nightly     NON FORMULARY Biest / Dhea                  1 gel daily     OVER THE COUNTER MEDICATION ANXIO CALM FOR ANXIETY     oxybutynin (DITROPAN) 5 MG tablet Take 2.5 mg by mouth 2 (two) times  daily.     progesterone (PROMETRIUM) 100 MG capsule Take 100 mg by mouth at bedtime.     traMADol (ULTRAM) 50 MG tablet Take 50 mg by mouth every 6 (six) hours as needed.     vitamin E 1000 UNIT capsule Take by mouth.     Vitamin E 670 MG (1000 UT) CAPS Take by mouth.     Current Facility-Administered Medications  Medication Dose Route Frequency Provider Last Rate Last Admin   0.9 %  sodium chloride infusion  500 mL Intravenous Continuous Lynann Bologna, MD  Allergies  Allergen Reactions   Aminophylline Rash   Amitriptyline Rash   Citalopram Rash   Clarithromycin Rash   Fluoxetine Rash   Latex Dermatitis   Olanzapine Rash   Other Dermatitis   Oxycodone Itching   Paroxetine Hcl Rash   Tioconazole Rash   Triamterene Rash   Triazolam Rash    Review of Systems:  neg     Physical Exam:    BP 120/70   Pulse 84   Ht 5' 3.5" (1.613 m)   Wt 169 lb (76.7 kg)   BMI 29.47 kg/m  Filed Weights   02/13/23 1459  Weight: 169 lb (76.7 kg)   Constitutional:  Well-developed, in no acute distress. Psychiatric: Normal mood and affect. Behavior is normal. HEENT: Pupils normal.  Conjunctivae are normal. No scleral icterus.  Cardiovascular: Normal rate, regular rhythm. No edema Pulmonary/chest: Effort normal and breath sounds normal. No wheezing, rales or rhonchi. Abdominal: Soft, nondistended. Nontender. Bowel sounds active throughout. There are no masses palpable. No hepatomegaly. Rectal:  defered Neurological: Alert and oriented to person place and time. Skin: Skin is warm and dry. No rashes noted.    Edman Circle, MD   Cc: Dr Samuel Germany

## 2023-02-13 NOTE — Patient Instructions (Addendum)
_______________________________________________________  If your blood pressure at your visit was 140/90 or greater, please contact your primary care physician to follow up on this.  _______________________________________________________  If you are age 61 or older, your body mass index should be between 23-30. Your Body mass index is 29.47 kg/m. If this is out of the aforementioned range listed, please consider follow up with your Primary Care Provider.  If you are age 36 or younger, your body mass index should be between 19-25. Your Body mass index is 29.47 kg/m. If this is out of the aformentioned range listed, please consider follow up with your Primary Care Provider.   ________________________________________________________  The [place] [facility] providers would like to encourage you to use [facility] to communicate with providers for non-urgent requests or questions.  Due to long hold times on the telephone, sending your provider a message by [facility] may be a faster and more efficient way to get a response.  Please allow 48 business hours for a response.  Please remember that this is for non-urgent requests.  _______________________________________________________  We have sent the following medications to your pharmacy for you to pick up at your convenience: Nexium 20mg  daily  Please purchase the following medications over the counter and take as directed: Miralax 17g daily  Thank you,  Dr. Lynann Bologna

## 2023-03-08 DIAGNOSIS — L820 Inflamed seborrheic keratosis: Secondary | ICD-10-CM | POA: Diagnosis not present

## 2023-03-08 DIAGNOSIS — L739 Follicular disorder, unspecified: Secondary | ICD-10-CM | POA: Diagnosis not present

## 2023-03-13 DIAGNOSIS — R5383 Other fatigue: Secondary | ICD-10-CM | POA: Diagnosis not present

## 2023-03-13 DIAGNOSIS — F418 Other specified anxiety disorders: Secondary | ICD-10-CM | POA: Diagnosis not present

## 2023-03-13 DIAGNOSIS — G5792 Unspecified mononeuropathy of left lower limb: Secondary | ICD-10-CM | POA: Diagnosis not present

## 2023-03-13 DIAGNOSIS — N3281 Overactive bladder: Secondary | ICD-10-CM | POA: Diagnosis not present

## 2023-03-13 DIAGNOSIS — E785 Hyperlipidemia, unspecified: Secondary | ICD-10-CM | POA: Diagnosis not present

## 2023-03-13 DIAGNOSIS — K219 Gastro-esophageal reflux disease without esophagitis: Secondary | ICD-10-CM | POA: Diagnosis not present

## 2023-03-13 DIAGNOSIS — M797 Fibromyalgia: Secondary | ICD-10-CM | POA: Diagnosis not present

## 2023-03-13 DIAGNOSIS — Z1231 Encounter for screening mammogram for malignant neoplasm of breast: Secondary | ICD-10-CM | POA: Diagnosis not present

## 2023-03-13 DIAGNOSIS — F5104 Psychophysiologic insomnia: Secondary | ICD-10-CM | POA: Diagnosis not present

## 2023-04-02 DIAGNOSIS — Z9181 History of falling: Secondary | ICD-10-CM | POA: Diagnosis not present

## 2023-04-02 DIAGNOSIS — Z0000 Encounter for general adult medical examination without abnormal findings: Secondary | ICD-10-CM | POA: Diagnosis not present

## 2023-04-16 DIAGNOSIS — M47811 Spondylosis without myelopathy or radiculopathy, occipito-atlanto-axial region: Secondary | ICD-10-CM | POA: Diagnosis not present

## 2023-04-16 DIAGNOSIS — M5388 Other specified dorsopathies, sacral and sacrococcygeal region: Secondary | ICD-10-CM | POA: Diagnosis not present

## 2023-04-16 DIAGNOSIS — M9902 Segmental and somatic dysfunction of thoracic region: Secondary | ICD-10-CM | POA: Diagnosis not present

## 2023-04-16 DIAGNOSIS — M9901 Segmental and somatic dysfunction of cervical region: Secondary | ICD-10-CM | POA: Diagnosis not present

## 2023-04-16 DIAGNOSIS — M47893 Other spondylosis, cervicothoracic region: Secondary | ICD-10-CM | POA: Diagnosis not present

## 2023-04-16 DIAGNOSIS — M9904 Segmental and somatic dysfunction of sacral region: Secondary | ICD-10-CM | POA: Diagnosis not present

## 2023-05-08 DIAGNOSIS — Z1231 Encounter for screening mammogram for malignant neoplasm of breast: Secondary | ICD-10-CM | POA: Diagnosis not present

## 2023-05-14 DIAGNOSIS — M25511 Pain in right shoulder: Secondary | ICD-10-CM | POA: Diagnosis not present

## 2023-06-12 DIAGNOSIS — N3281 Overactive bladder: Secondary | ICD-10-CM | POA: Diagnosis not present

## 2023-06-12 DIAGNOSIS — R110 Nausea: Secondary | ICD-10-CM | POA: Diagnosis not present

## 2023-06-12 DIAGNOSIS — G5792 Unspecified mononeuropathy of left lower limb: Secondary | ICD-10-CM | POA: Diagnosis not present

## 2023-06-12 DIAGNOSIS — E785 Hyperlipidemia, unspecified: Secondary | ICD-10-CM | POA: Diagnosis not present

## 2023-06-12 DIAGNOSIS — K219 Gastro-esophageal reflux disease without esophagitis: Secondary | ICD-10-CM | POA: Diagnosis not present

## 2023-06-12 DIAGNOSIS — T753XXA Motion sickness, initial encounter: Secondary | ICD-10-CM | POA: Diagnosis not present

## 2023-06-12 DIAGNOSIS — R5383 Other fatigue: Secondary | ICD-10-CM | POA: Diagnosis not present

## 2023-06-12 DIAGNOSIS — M797 Fibromyalgia: Secondary | ICD-10-CM | POA: Diagnosis not present

## 2023-06-13 DIAGNOSIS — B351 Tinea unguium: Secondary | ICD-10-CM | POA: Diagnosis not present

## 2023-06-18 DIAGNOSIS — B351 Tinea unguium: Secondary | ICD-10-CM | POA: Diagnosis not present

## 2023-07-11 DIAGNOSIS — D519 Vitamin B12 deficiency anemia, unspecified: Secondary | ICD-10-CM | POA: Diagnosis not present

## 2023-07-11 DIAGNOSIS — D509 Iron deficiency anemia, unspecified: Secondary | ICD-10-CM | POA: Diagnosis not present

## 2023-07-11 DIAGNOSIS — E559 Vitamin D deficiency, unspecified: Secondary | ICD-10-CM | POA: Diagnosis not present

## 2023-07-11 DIAGNOSIS — F5104 Psychophysiologic insomnia: Secondary | ICD-10-CM | POA: Diagnosis not present

## 2023-07-11 DIAGNOSIS — B351 Tinea unguium: Secondary | ICD-10-CM | POA: Diagnosis not present

## 2023-07-11 DIAGNOSIS — R5383 Other fatigue: Secondary | ICD-10-CM | POA: Diagnosis not present

## 2023-07-24 ENCOUNTER — Other Ambulatory Visit: Payer: Self-pay | Admitting: Gastroenterology

## 2023-07-31 DIAGNOSIS — F410 Panic disorder [episodic paroxysmal anxiety] without agoraphobia: Secondary | ICD-10-CM | POA: Diagnosis not present

## 2023-09-13 ENCOUNTER — Ambulatory Visit: Admitting: Podiatry

## 2023-09-13 DIAGNOSIS — B351 Tinea unguium: Secondary | ICD-10-CM | POA: Diagnosis not present

## 2023-09-13 DIAGNOSIS — G609 Hereditary and idiopathic neuropathy, unspecified: Secondary | ICD-10-CM | POA: Diagnosis not present

## 2023-09-13 DIAGNOSIS — L603 Nail dystrophy: Secondary | ICD-10-CM

## 2023-09-13 NOTE — Progress Notes (Signed)
 Subjective:  Patient ID: Chelsea Bennett, female    DOB: Feb 28, 1961,  MRN: [id number]  Chelsea Bennett presents to clinic today for concern of fungus in the bilateral great toenails.  She notes that she had polish on for a long time recently and then went on a cruise.  The polish has only been off for the past 3 weeks and she immediately noted discoloration of the nail during that time.  She does not feel that the discoloration has improved in the past 3 weeks.  She has tried over-the-counter antifungals for a 3-week period.  She feels it is not working.  She notes that her primary care physician placed her on gabapentin  300 mg for neuropathy in the left 2nd and 3rd toes.  She notes that she did not like the side effects, so she is taking 100 mg, but not taking it consistently.  Her PCP is not aware of her medicine change.  PCP is Adrian Hopper, MD.  Past Medical History:  Diagnosis Date   Abnormal stress test 04/06/2016   Formatting of this note might be different from the original. Added automatically from request for surgery [id number] Formatting of this note might be different from the original. Overview:  Added automatically from request for surgery [id number]   Allergy    Anemia    with pregnancy   Anxiety    Arthritis    Cataract    removed bilateraly   COVID    Depression    Dysphagia    Exertional chest pain 04/06/2016   Formatting of this note might be different from the original. Added automatically from request for surgery [id number] Formatting of this note might be different from the original. Overview:  Added automatically from request for surgery [id number]   Fibromyalgia    GERD (gastroesophageal reflux disease)    Hyperlipidemia    Shingles    Vitamin B12 deficiency    Past Surgical History:  Procedure Laterality Date   ABDOMINAL HYSTERECTOMY     APPENDECTOMY     CATARACT EXTRACTION     CESAREAN SECTION     CHOLECYSTECTOMY     COLONOSCOPY  12/13/2011   Mild  sigmoid diverticulosis. Small internal hemorrhoids. Otherwise noraml TI.    COLONOSCOPY     july 2024   ESOPHAGOGASTRODUODENOSCOPY  12/13/2011   Minimal hiatal hernia. Mild gastrtiis.    HEMORRHOID SURGERY     SHOULDER SURGERY     Allergies  Allergen Reactions   Aminophylline Rash   Amitriptyline Rash   Citalopram Rash   Clarithromycin Rash   Fluoxetine Rash   Latex Dermatitis   Olanzapine Rash   Other Dermatitis   Oxycodone Itching   Paroxetine Hcl Rash   Tioconazole Rash   Triamterene Rash   Triazolam Rash    Review of Systems: Negative except as noted in the HPI.  Objective:  Vascular Examination: Capillary refill time is 3-5 seconds to toes bilateral. Palpable pedal pulses b/l LE. Digital hair present b/l.    Dermatological Examination: Pedal skin with normal turgor, texture and tone b/l. No open wounds. No interdigital macerations b/l.  The bilateral hallux toenails are 3mm thick, discolored, without subungual debris. There is pain with compression of the nail plates.  There is approximately 15% clearing of the nail discoloration near the eponychium.     Latest Ref Rng & Units 12/13/2022   11:53 AM  Hemoglobin A1C  Hemoglobin-A1c 4.8 - 5.6 % 5.2    Assessment/Plan:  1. Nail dystrophy   2. Dermatophytosis of nail   3. Idiopathic peripheral neuropathy    Clippings of the affected/possible fungal toenails were obtained and [facility] fungal nail culture.  Informed patient it may take up to 3 weeks to receive the final report.  Will contact the patient to review the results.  We will discuss treatment plan at that time and follow-up.  Based on how the nails look today, I feel that the discoloration is most likely from the prolonged nail polish and not due to fungus, but we will wait on confirmation.  The proximal 15% clearing from new nail growth over 3-week duration makes me lean towards the distal portion being the residual discoloration from nail  polish  Discussed topical, oral, and laser nail treatment options with the patient today.  Discussed risks involved as well.  Will await fungal culture results and reach out to patient to discuss.  Recommend patient speak to her PCP regarding the self-reported change in gabapentin  dosing regimen   Lamar Meter DEstle Hemp, DPM, FACFAS [facility]     [address] [place], [place] [address]                Office [phone]  Fax [phone]

## 2023-09-18 DIAGNOSIS — G5792 Unspecified mononeuropathy of left lower limb: Secondary | ICD-10-CM | POA: Diagnosis not present

## 2023-09-18 DIAGNOSIS — F5104 Psychophysiologic insomnia: Secondary | ICD-10-CM | POA: Diagnosis not present

## 2023-09-18 DIAGNOSIS — R5383 Other fatigue: Secondary | ICD-10-CM | POA: Diagnosis not present

## 2023-09-18 DIAGNOSIS — F418 Other specified anxiety disorders: Secondary | ICD-10-CM | POA: Diagnosis not present

## 2023-09-18 DIAGNOSIS — E785 Hyperlipidemia, unspecified: Secondary | ICD-10-CM | POA: Diagnosis not present

## 2023-09-18 DIAGNOSIS — N3281 Overactive bladder: Secondary | ICD-10-CM | POA: Diagnosis not present

## 2023-09-18 DIAGNOSIS — K219 Gastro-esophageal reflux disease without esophagitis: Secondary | ICD-10-CM | POA: Diagnosis not present

## 2023-09-18 DIAGNOSIS — M797 Fibromyalgia: Secondary | ICD-10-CM | POA: Diagnosis not present

## 2023-09-28 ENCOUNTER — Ambulatory Visit: Payer: Self-pay | Admitting: Podiatry

## 2023-11-14 DIAGNOSIS — Z79899 Other long term (current) drug therapy: Secondary | ICD-10-CM | POA: Diagnosis not present

## 2023-11-14 DIAGNOSIS — R079 Chest pain, unspecified: Secondary | ICD-10-CM | POA: Diagnosis not present

## 2023-11-14 DIAGNOSIS — F419 Anxiety disorder, unspecified: Secondary | ICD-10-CM | POA: Diagnosis not present

## 2023-11-14 DIAGNOSIS — J45909 Unspecified asthma, uncomplicated: Secondary | ICD-10-CM | POA: Diagnosis not present

## 2023-11-14 DIAGNOSIS — R002 Palpitations: Secondary | ICD-10-CM | POA: Diagnosis not present

## 2023-11-15 DIAGNOSIS — R002 Palpitations: Secondary | ICD-10-CM | POA: Diagnosis not present

## 2023-11-15 DIAGNOSIS — R079 Chest pain, unspecified: Secondary | ICD-10-CM | POA: Diagnosis not present

## 2023-12-07 DIAGNOSIS — R002 Palpitations: Secondary | ICD-10-CM | POA: Diagnosis not present

## 2023-12-12 DIAGNOSIS — B3731 Acute candidiasis of vulva and vagina: Secondary | ICD-10-CM | POA: Diagnosis not present

## 2023-12-12 DIAGNOSIS — L02511 Cutaneous abscess of right hand: Secondary | ICD-10-CM | POA: Diagnosis not present

## 2023-12-18 DIAGNOSIS — F419 Anxiety disorder, unspecified: Secondary | ICD-10-CM | POA: Diagnosis not present

## 2023-12-18 DIAGNOSIS — R079 Chest pain, unspecified: Secondary | ICD-10-CM | POA: Diagnosis not present

## 2023-12-18 DIAGNOSIS — R002 Palpitations: Secondary | ICD-10-CM | POA: Diagnosis not present

## 2023-12-19 DIAGNOSIS — R079 Chest pain, unspecified: Secondary | ICD-10-CM | POA: Diagnosis not present

## 2023-12-19 DIAGNOSIS — F418 Other specified anxiety disorders: Secondary | ICD-10-CM | POA: Diagnosis not present

## 2023-12-19 DIAGNOSIS — F5104 Psychophysiologic insomnia: Secondary | ICD-10-CM | POA: Diagnosis not present

## 2023-12-19 DIAGNOSIS — G5792 Unspecified mononeuropathy of left lower limb: Secondary | ICD-10-CM | POA: Diagnosis not present

## 2023-12-19 DIAGNOSIS — R5383 Other fatigue: Secondary | ICD-10-CM | POA: Diagnosis not present

## 2023-12-19 DIAGNOSIS — N3281 Overactive bladder: Secondary | ICD-10-CM | POA: Diagnosis not present

## 2023-12-19 DIAGNOSIS — M797 Fibromyalgia: Secondary | ICD-10-CM | POA: Diagnosis not present

## 2023-12-19 DIAGNOSIS — K219 Gastro-esophageal reflux disease without esophagitis: Secondary | ICD-10-CM | POA: Diagnosis not present

## 2023-12-19 DIAGNOSIS — E785 Hyperlipidemia, unspecified: Secondary | ICD-10-CM | POA: Diagnosis not present

## 2024-01-10 DIAGNOSIS — Z1231 Encounter for screening mammogram for malignant neoplasm of breast: Secondary | ICD-10-CM | POA: Diagnosis not present

## 2024-01-10 DIAGNOSIS — Z01419 Encounter for gynecological examination (general) (routine) without abnormal findings: Secondary | ICD-10-CM | POA: Diagnosis not present

## 2024-01-10 DIAGNOSIS — N3941 Urge incontinence: Secondary | ICD-10-CM | POA: Diagnosis not present

## 2024-01-23 ENCOUNTER — Ambulatory Visit: Admitting: Podiatry

## 2024-02-07 DIAGNOSIS — J329 Chronic sinusitis, unspecified: Secondary | ICD-10-CM | POA: Diagnosis not present

## 2024-02-07 DIAGNOSIS — R0981 Nasal congestion: Secondary | ICD-10-CM | POA: Diagnosis not present

## 2024-02-07 DIAGNOSIS — J343 Hypertrophy of nasal turbinates: Secondary | ICD-10-CM | POA: Diagnosis not present

## 2024-02-14 DIAGNOSIS — J329 Chronic sinusitis, unspecified: Secondary | ICD-10-CM | POA: Diagnosis not present

## 2024-02-15 DIAGNOSIS — J329 Chronic sinusitis, unspecified: Secondary | ICD-10-CM | POA: Diagnosis not present

## 2024-05-13 ENCOUNTER — Ambulatory Visit: Admitting: Gastroenterology
